# Patient Record
Sex: Male | Born: 1955 | Race: Black or African American | Hispanic: No | Marital: Single
Health system: Midwestern US, Community
[De-identification: ages and names within clinical notes are randomized; demographics above are authoritative.]

## PROBLEM LIST (undated history)

## (undated) DIAGNOSIS — I10 Essential (primary) hypertension: Secondary | ICD-10-CM

## (undated) DIAGNOSIS — N3289 Other specified disorders of bladder: Principal | ICD-10-CM

## (undated) DIAGNOSIS — N329 Bladder disorder, unspecified: Secondary | ICD-10-CM

## (undated) DIAGNOSIS — R195 Other fecal abnormalities: Secondary | ICD-10-CM

## (undated) DIAGNOSIS — Z1211 Encounter for screening for malignant neoplasm of colon: Secondary | ICD-10-CM

## (undated) DIAGNOSIS — I1A0 Resistant hypertension: Secondary | ICD-10-CM

## (undated) DIAGNOSIS — L723 Sebaceous cyst: Secondary | ICD-10-CM

## (undated) DIAGNOSIS — L720 Epidermal cyst: Secondary | ICD-10-CM

## (undated) DIAGNOSIS — R011 Cardiac murmur, unspecified: Secondary | ICD-10-CM

## (undated) HISTORY — PX: COLONOSCOPY: SHX5424

---

## 2013-05-23 ENCOUNTER — Emergency Department (HOSPITAL_COMMUNITY)
Admission: EM | Admit: 2013-05-23 | Discharge: 2013-05-24 | Disposition: A | Discharge status: Home / Self Care | Payer: 59 | Attending: Emergency Medicine | Admitting: Emergency Medicine

## 2013-05-23 ENCOUNTER — Encounter (HOSPITAL_COMMUNITY): Payer: Self-pay | Admitting: Emergency Medicine

## 2013-05-23 ENCOUNTER — Emergency Department (HOSPITAL_COMMUNITY): Payer: 59

## 2013-05-23 DIAGNOSIS — F17200 Nicotine dependence, unspecified, uncomplicated: Secondary | ICD-10-CM | POA: Insufficient documentation

## 2013-05-23 DIAGNOSIS — R55 Syncope and collapse: Secondary | ICD-10-CM

## 2013-05-23 DIAGNOSIS — R011 Cardiac murmur, unspecified: Secondary | ICD-10-CM | POA: Insufficient documentation

## 2013-05-23 DIAGNOSIS — R112 Nausea with vomiting, unspecified: Secondary | ICD-10-CM | POA: Insufficient documentation

## 2013-05-23 DIAGNOSIS — R61 Generalized hyperhidrosis: Secondary | ICD-10-CM | POA: Insufficient documentation

## 2013-05-23 HISTORY — DX: Cardiac murmur, unspecified: R01.1

## 2013-05-23 LAB — I-STAT TROPONIN, ED: TROPONIN I, POC: 0 ng/mL (ref 0.00–0.08)

## 2013-05-23 LAB — CBC WITH DIFFERENTIAL/PLATELET
BASOS ABS: 0 10*3/uL (ref 0.0–0.1)
Basophils Relative: 1 % (ref 0–1)
EOS ABS: 0.1 10*3/uL (ref 0.0–0.7)
EOS PCT: 1 % (ref 0–5)
HEMATOCRIT: 40.2 % (ref 39.0–52.0)
Hemoglobin: 13.4 g/dL (ref 13.0–17.0)
Lymphocytes Relative: 28 % (ref 12–46)
Lymphs Abs: 1.8 10*3/uL (ref 0.7–4.0)
MCH: 28.5 pg (ref 26.0–34.0)
MCHC: 33.3 g/dL (ref 30.0–36.0)
MCV: 85.5 fL (ref 78.0–100.0)
MONO ABS: 0.4 10*3/uL (ref 0.1–1.0)
Monocytes Relative: 6 % (ref 3–12)
Neutro Abs: 4.3 10*3/uL (ref 1.7–7.7)
Neutrophils Relative %: 64 % (ref 43–77)
Platelets: 228 10*3/uL (ref 150–400)
RBC: 4.7 MIL/uL (ref 4.22–5.81)
RDW: 15.3 % (ref 11.5–15.5)
WBC: 6.7 10*3/uL (ref 4.0–10.5)

## 2013-05-23 LAB — URINALYSIS, ROUTINE W REFLEX MICROSCOPIC
Bilirubin Urine: NEGATIVE
Glucose, UA: NEGATIVE mg/dL
Ketones, ur: 15 mg/dL — AB
Nitrite: NEGATIVE
PH: 6 (ref 5.0–8.0)
Protein, ur: NEGATIVE mg/dL
Specific Gravity, Urine: 1.02 (ref 1.005–1.030)
Urobilinogen, UA: 0.2 mg/dL (ref 0.0–1.0)

## 2013-05-23 LAB — COMPREHENSIVE METABOLIC PANEL
ALT: 17 U/L (ref 0–53)
AST: 17 U/L (ref 0–37)
Albumin: 3.7 g/dL (ref 3.5–5.2)
Alkaline Phosphatase: 65 U/L (ref 39–117)
BUN: 17 mg/dL (ref 6–23)
CALCIUM: 8.8 mg/dL (ref 8.4–10.5)
CO2: 22 meq/L (ref 19–32)
CREATININE: 1.49 mg/dL — AB (ref 0.50–1.35)
Chloride: 105 mEq/L (ref 96–112)
GFR calc Af Amer: 59 mL/min — ABNORMAL LOW (ref >90)
GFR, EST NON AFRICAN AMERICAN: 51 mL/min — AB (ref >90)
Glucose, Bld: 93 mg/dL (ref 70–99)
Potassium: 3.9 mEq/L (ref 3.7–5.3)
Sodium: 144 mEq/L (ref 137–147)
Total Bilirubin: <0.2 mg/dL — ABNORMAL LOW (ref 0.3–1.2)
Total Protein: 7.4 g/dL (ref 6.0–8.3)

## 2013-05-23 LAB — I-STAT CHEM 8, ED
BUN: 17 mg/dL (ref 6–23)
CHLORIDE: 107 meq/L (ref 96–112)
Calcium, Ion: 1.12 mmol/L (ref 1.12–1.23)
Creatinine, Ser: 1.4 mg/dL — ABNORMAL HIGH (ref 0.50–1.35)
Glucose, Bld: 99 mg/dL (ref 70–99)
HCT: 41 % (ref 39.0–52.0)
Hemoglobin: 13.9 g/dL (ref 13.0–17.0)
POTASSIUM: 4.4 meq/L (ref 3.7–5.3)
SODIUM: 144 meq/L (ref 137–147)
TCO2: 22 mmol/L (ref 0–100)

## 2013-05-23 LAB — URINE MICROSCOPIC-ADD ON

## 2013-05-23 LAB — ETHANOL: Alcohol, Ethyl (B): 26 mg/dL — ABNORMAL HIGH (ref 0–11)

## 2013-05-23 LAB — I-STAT CG4 LACTIC ACID, ED
Lactic Acid, Venous: 3.34 mmol/L — ABNORMAL HIGH (ref 0.5–2.2)
Lactic Acid, Venous: 1.62 mmol/L (ref 0.5–2.2)

## 2013-05-23 MED ORDER — SODIUM CHLORIDE 0.9 % IV BOLUS (SEPSIS)
1000.0000 mL | Freq: Once | INTRAVENOUS | Status: AC
  Administered 2013-05-23: 1000 mL via INTRAVENOUS

## 2013-05-23 NOTE — ED Notes (Addendum)
Pt to ED via [facility] for evaluation of a syncopal episode prior to arrival- family reports pt was in seated position when he slumped out of chair and "fell out"- pt became alert 30 seconds after episode, pt was incontinent of urine, denies any injury during syncopal episode.  EMS found palpated BP of 60, IV started and 700cc NS given, pt current BP 135/78- pt alert and oriented X 4 and speaking in full sentences.  Pt had 2 episodes of vomiting with EMS, 4mg  Zofran given.  IV in place.

## 2013-05-23 NOTE — Discharge Instructions (Signed)
Syncope  Syncope is a fainting spell. This means the person loses consciousness and drops to the ground. The person is generally unconscious for less than 5 minutes. The person may have some muscle twitches for up to 15 seconds before waking up and returning to normal. Syncope occurs more often in elderly people, but it can happen to anyone. While most causes of syncope are not dangerous, syncope can be a sign of a serious medical problem. It is important to seek medical care.   CAUSES   Syncope is caused by a sudden decrease in blood flow to the brain. The specific cause is often not determined. Factors that can trigger syncope include:   Taking medicines that lower blood pressure.   Sudden changes in posture, such as standing up suddenly.   Taking more medicine than prescribed.   Standing in one place for too long.   Seizure disorders.   Dehydration and excessive exposure to heat.   Low blood sugar (hypoglycemia).   Straining to have a bowel movement.   Heart disease, irregular heartbeat, or other circulatory problems.   Fear, emotional distress, seeing blood, or severe pain.  SYMPTOMS   Right before fainting, you may:   Feel dizzy or lightheaded.   Feel nauseous.   See all white or all black in your field of vision.   Have cold, clammy skin.  DIAGNOSIS   Your caregiver will ask about your symptoms, perform a physical exam, and perform electrocardiography (ECG) to record the electrical activity of your heart. Your caregiver may also perform other heart or blood tests to determine the cause of your syncope.  TREATMENT   In most cases, no treatment is needed. Depending on the cause of your syncope, your caregiver may recommend changing or stopping some of your medicines.  HOME CARE INSTRUCTIONS   Have someone stay with you until you feel stable.   Do not drive, operate machinery, or play sports until your caregiver says it is okay.   Keep all follow-up appointments as directed by your  caregiver.   Lie down right away if you start feeling like you might faint. Breathe deeply and steadily. Wait until all the symptoms have passed.   Drink enough fluids to keep your urine clear or pale yellow.   If you are taking blood pressure or heart medicine, get up slowly, taking several minutes to sit and then stand. This can reduce dizziness.  SEEK IMMEDIATE MEDICAL CARE IF:    You have a severe headache.   You have unusual pain in the chest, abdomen, or back.   You are bleeding from the mouth or rectum, or you have black or tarry stool.   You have an irregular or very fast heartbeat.   You have pain with breathing.   You have repeated fainting or seizure-like jerking during an episode.   You faint when sitting or lying down.   You have confusion.   You have difficulty walking.   You have severe weakness.   You have vision problems.  If you fainted, call your local emergency services (911 in [place] Do not drive yourself to the hospital.   MAKE SURE YOU:   Understand these instructions.   Will watch your condition.   Will get help right away if you are not doing well or get worse.  Document Released: 12/31/2004 Document Revised: 07/02/2011 Document Reviewed: 03/01/2011  [employer] Patient Information 2014 [employer].

## 2013-05-23 NOTE — ED Provider Notes (Signed)
CSN: [id number]     Arrival date & time 05/23/13  2003 History   First MD Initiated Contact with Patient 05/23/13 2007     Chief Complaint  Patient presents with  . Near Syncope     (Consider location/radiation/quality/duration/timing/severity/associated sxs/prior Treatment) HPI Comments: Patient is a 58 year old male with history of heart murmur who presents today for evaluation of syncope. He reports that he is from [place], but is here visiting and went to a Mother's Day gathering. He states that he felt "really hot" and nauseated just prior to syncopizing. He was sitting in a chair and slumped over in the chair. He did not hit his head or sustain any injuries. This was witnessed by his family members who report he lost consciousness for about 30 seconds. He was incontinent of urine. He has 2 episodes of vomiting. He had not eaten much all day long, but did eat at this gathering. He also feels as though he is sleep deprived. He was up late watching tv last night and woke up early this morning to drive here. He has had a similar episode of syncope in the past. [place]. He was evaluated in the hospital where they gave him an IV, told him he was dehydrated, and sent him home. He denies chest pain, shortness of breath, or recent illness.   Patient is a 58 y.o. male presenting with near-syncope. The history is provided by the patient. No language interpreter was used.  Near Syncope Associated symptoms include diaphoresis, nausea and vomiting. Pertinent negatives include no abdominal pain, chest pain, chills or fever.    Past Medical History  Diagnosis Date  . Heart murmur    Past Surgical History  Procedure Laterality Date  . Colonoscopy     No family history on file. History  Substance Use Topics  . Smoking status: Current Some Day Smoker  . Smokeless tobacco: Not on file  . Alcohol Use: Yes    Review of Systems  Constitutional:  Positive for diaphoresis. Negative for fever and chills.  Respiratory: Negative for shortness of breath.   Cardiovascular: Positive for near-syncope. Negative for chest pain.  Gastrointestinal: Positive for nausea and vomiting. Negative for abdominal pain and diarrhea.  Neurological: Positive for syncope.  All other systems reviewed and are negative.     Allergies  Review of patient's allergies indicates no known allergies.  Home Medications   Prior to Admission medications   Not on File   BP 135/78  Temp(Src) 98 F (36.7 C) (Oral)  Resp 17  SpO2 96% Physical Exam  Nursing note and vitals reviewed. Constitutional: He is oriented to person, place, and time. He appears well-developed and well-nourished. No distress.  HENT:  Head: Normocephalic and atraumatic.  Right Ear: External ear normal.  Left Ear: External ear normal.  Nose: Nose normal.  Eyes: Conjunctivae and EOM are normal. Pupils are equal, round, and reactive to light.  Neck: Normal range of motion. No spinous process tenderness and no muscular tenderness present. No tracheal deviation present.  Cardiovascular: Normal rate, regular rhythm and normal heart sounds.   Pulmonary/Chest: Effort normal and breath sounds normal. No stridor.  Abdominal: Soft. He exhibits no distension. There is no tenderness.  Musculoskeletal: Normal range of motion.  Neurological: He is alert and oriented to person, place, and time.  Skin: Skin is warm and dry. He is not diaphoretic.  Psychiatric: He has a normal mood and  affect. His behavior is normal.    ED Course  Procedures (including critical care time) Labs Review Labs Reviewed  COMPREHENSIVE METABOLIC PANEL - Abnormal; Notable for the following:    Creatinine, Ser 1.49 (*)    Total Bilirubin <0.2 (*)    GFR calc non Af Amer 51 (*)    GFR calc Af Amer 59 (*)    All other components within normal limits  ETHANOL - Abnormal; Notable for the following:    Alcohol, Ethyl (B) 26  (*)    All other components within normal limits  URINALYSIS, ROUTINE W REFLEX MICROSCOPIC - Abnormal; Notable for the following:    Hgb urine dipstick TRACE (*)    Ketones, ur 15 (*)    Leukocytes, UA TRACE (*)    All other components within normal limits  URINE MICROSCOPIC-ADD ON - Abnormal; Notable for the following:    Bacteria, UA FEW (*)    Casts HYALINE CASTS (*)    All other components within normal limits  I-STAT CG4 LACTIC ACID, ED - Abnormal; Notable for the following:    Lactic Acid, Venous 3.34 (*)    All other components within normal limits  I-STAT CHEM 8, ED - Abnormal; Notable for the following:    Creatinine, Ser 1.40 (*)    All other components within normal limits  URINE CULTURE  CBC WITH DIFFERENTIAL  I-STAT TROPOININ, ED  I-STAT CG4 LACTIC ACID, ED  I-STAT CG4 LACTIC ACID, ED  I-STAT CHEM 8, ED    Imaging Review Dg Chest Port 1 View  05/23/2013   CLINICAL DATA:  Syncopal episode  EXAM: PORTABLE CHEST - 1 VIEW  COMPARISON:  None.  FINDINGS: Cardiac shadow is mildly enlarged. The lungs are clear bilaterally. No acute bony abnormality is seen.  IMPRESSION: No acute abnormality noted.   Electronically Signed   By: Alcide CleverMark  Lukens M.D.   On: 05/23/2013 21:08     EKG Interpretation   Date/Time:  Sunday May 23 2013 20:15:46 EDT Ventricular Rate:  93 PR Interval:  149 QRS Duration: 87 QT Interval:  359 QTC Calculation: 446 R Axis:   21 Text Interpretation:  Sinus rhythm Borderline T abnormalities, inferior  leads No prior EKG for comparison Confirmed by Gwendolyn GrantWALDEN  MD, BLAIR ([id number]) on  05/23/2013 8:23:53 PM      MDM   Final diagnoses:  Syncope   Patient is a 58 year old male who presents to the ED for evaluation of syncope. Initially lactic acid was elevated at 3.34. This normalized with fluid. Labs are otherwise unremarkable. EKG shows no arrythmia to suggest cardiac etiology of syncope. Patient feels significantly improved after fluid. No recurrence of  symptoms. Discussed reasons to return to ED immediately. Vital signs stable for discharge. Dr. Gwendolyn GrantWalden evaluated this patient and agrees with plan. Patient / Family / Caregiver informed of clinical course, understand medical decision-making process, and agree with plan.   Mora BellmanHannah S Myla Mauriello, PA-C 05/25/13 1501

## 2013-05-23 NOTE — ED Notes (Signed)
Pt given urinal and told we need urine specimen.  Completing EMS NS bolus before starting 2nd bolus.

## 2013-05-23 NOTE — ED Notes (Signed)
Pt asked if he could obtain a urine sample. Pt couldn't at this time. Will follow up shortly.

## 2013-05-23 NOTE — ED Notes (Signed)
Dr Gwendolyn GrantWalden given a copy of lactic acid results 3.34

## 2013-05-24 LAB — URINE CULTURE: Colony Count: 50000

## 2013-05-27 NOTE — ED Provider Notes (Signed)
Medical screening examination/treatment/procedure(s) were conducted as a shared visit with non-physician practitioner(s) and myself.  I personally evaluated the patient during the encounter.   EKG Interpretation   Date/Time:  Sunday May 23 2013 20:15:46 EDT Ventricular Rate:  93 PR Interval:  149 QRS Duration: 87 QT Interval:  359 QTC Calculation: 446 R Axis:   21 Text Interpretation:  Sinus rhythm Borderline T abnormalities, inferior  leads No prior EKG for comparison Confirmed by Aayushi Solorzano  MD, Kaiden Dardis ([id number]) on  05/23/2013 8:23:53 PM      57 M here with syncope. Happened previously due to dehydration. Has been cooking outside over a grill most of the day. Patient here feeling well, vitals ok. Initial lactate 3.34, improved after fluid. Labs ok. Neurologically intact, relaxing comfortably. Feeling much better after fluids, syncope likely due to some dehydration. Stable for discharge.  [name], MD 05/27/13 [phone]

## 2013-08-31 ENCOUNTER — Inpatient Hospital Stay
Admit: 2013-08-31 | Discharge: 2013-08-31 | Disposition: A | Discharge status: Home / Self Care | Attending: Personal Emergency Response Attendant

## 2013-08-31 NOTE — ED Notes (Signed)
Patient cleared for discharge per MD. Patient discharge instructions explained, Rx given and explained to patient. Patient Verbalized understanding of all instructions and all patient questions answered to their satisfaction. Patient departs from ED in stable condition.       Deanne CofferJennifer N Elwyn Klosinski, RN  08/31/13 [phone]

## 2013-08-31 NOTE — ED Notes (Signed)
Bed: 04  Expected date:   Expected time:   Means of arrival:   Comments:  Adam Cochran, Adam Cochran

## 2013-08-31 NOTE — ED Provider Notes (Signed)
Attending Supervisory Note/Shared Visit   I have personally performed a face to face diagnostic evaluation on this patient. I have reviewed the mid-level???s findings and agree.  History and Exam by me shows infected sebaceous cyst      (Please note that portions of this note were completed with a voice recognition program.  Efforts were made to edit the dictations but occasionally words are mis-transcribed.)    Alveria Apley, MD  Attending Emergency Physician      Alveria Apley, MD  08/31/13 1910

## 2013-08-31 NOTE — ED Provider Notes (Signed)
[facility]  eMERGENCY dEPARTMENT eNCOUnter      Pt Name: Adam Cochran  MRN: [id number]  [place] Sep 21, 1955  Date of evaluation: 08/31/2013  Provider: Derrill Kay, NP    [place]       Chief Complaint   Patient presents with   ??? Facial Swelling     rt         HISTORY OF PRESENT ILLNESS  (Location/Symptom, Timing/Onset, Context/Setting, Quality, Duration, Modifying Factors, Severity.)   Adam Cochran is a 58 y.o. male who presents to the emergency department via private auto for a red, raised are to the right side of his face which has been draining. Onset was Thursday 08/26/13. States he woke up today with swelling to his right lower eyelid. Denies eye pain, vision changes, fever, chills. Rates his pain 6/10 at this time. States when the area started to drain he then started to squeeze the area.        Nursing Notes were reviewed.    ALLERGIES     Review of patient's allergies indicates no known allergies.    CURRENT MEDICATIONS       There are no discharge medications for this patient.      PAST MEDICAL HISTORY         Diagnosis Date   ??? Hypertension        SURGICAL HISTORY     History reviewed. No pertinent past surgical history.      FAMILY HISTORY     History reviewed. No pertinent family history.  No family status information on file.        SOCIAL HISTORY      reports that he has been smoking.  He does not have any smokeless tobacco history on file. He reports that he does not drink alcohol or use illicit drugs.    REVIEW OF SYSTEMS    (2-9 systems for level 4, 10 or more for level 5)   Review of Systems   Constitutional: Negative for fever, chills, diaphoresis, activity change, appetite change and fatigue.   HENT: Negative for congestion, ear discharge, ear pain, postnasal drip, rhinorrhea, sinus pressure and sore throat.    Eyes: Negative for photophobia, pain, discharge, redness, itching and visual disturbance.   Respiratory: Negative for cough and shortness of breath.     Cardiovascular: Negative for chest pain and palpitations.   Gastrointestinal: Negative for nausea, vomiting, abdominal pain and diarrhea.   Genitourinary: Negative for dysuria.   Musculoskeletal: Negative for myalgias, arthralgias, neck pain and neck stiffness.   Skin: Positive for color change and wound. Negative for rash.   Neurological: Negative for dizziness, weakness, light-headedness and headaches.     Except as noted above the remainder of the review of systems was reviewed and negative.     PHYSICAL EXAM    (up to 7 for level 4, 8 or more for level 5)   ED Triage Vitals   BP Temp Temp src Pulse Resp SpO2 Height Weight   -- -- -- -- -- -- -- --               Physical Exam   Constitutional: He is oriented to person, place, and time. He appears well-developed and well-nourished. No distress.   HENT:   Head: Normocephalic and atraumatic.   Mouth/Throat: Oropharynx is clear and moist.   Eyes: Conjunctivae and EOM are normal. Pupils are equal, round, and reactive to light. Right eye exhibits no discharge and no hordeolum. Left  eye exhibits no discharge and no hordeolum.   Neck: Normal range of motion. Neck supple.   Cardiovascular: Normal rate, regular rhythm and normal heart sounds.    Pulmonary/Chest: Effort normal and breath sounds normal. No respiratory distress. He has no wheezes. He has no rales.   Lymphadenopathy:     He has no cervical adenopathy.   Neurological: He is alert and oriented to person, place, and time.   Skin: Skin is warm and dry. No rash noted. He is not diaphoretic.   Erythematous, raised areas noted to right upper cheek. The lateral area appears infected. Mild swelling and erythema noted to right lower eyelid.    Vitals reviewed.      EMERGENCY DEPARTMENT COURSE and DIFFERENTIAL DIAGNOSIS/MDM:   Vitals:    Filed Vitals:    08/31/13 1923   BP: 133/78   Pulse: 87   Temp: 98.3 ??F (36.8 ??C)   TempSrc: Oral   Resp: 16   Height: 5' 10"$  (1.778 m)   Weight: 154 lb 1.6 oz (69.9 kg)   SpO2: 100%        CLINICAL DECISION MAKING:  The patient presented alert with a nontoxic appearance and was seen in conjunction with Dr. Philippa Chester. Prescriptions were written for bactrim and keflex. He was instructed to follow up with a family physician (a list was provided), return to ED if condition worsens.      FINAL IMPRESSION      1. Sebaceous cyst          DISPOSITION/PLAN   DISPOSITION Decision to Discharge    PATIENT REFERRED TO:   see list for family physician          [facility]  [address]  [place] [address]  [phone]    As needed, If symptoms worsen      DISCHARGE MEDICATIONS:     There are no discharge medications for this patient.          (Please note that portions of this note were completed with a voice recognition program.  Efforts were made to edit the dictations but occasionally words are mis-transcribed.)    Derrill Kay, NP      Derrill Kay, NP  08/31/13 2037

## 2015-01-26 ENCOUNTER — Encounter: Admit: 2015-01-26 | Discharge: 2015-02-09 | Primary: Internal Medicine

## 2015-01-26 ENCOUNTER — Inpatient Hospital Stay
Admit: 2015-01-26 | Discharge: 2015-01-27 | Disposition: A | Discharge status: Home / Self Care | Attending: Emergency Medicine

## 2015-01-26 DIAGNOSIS — S46912A Strain of unspecified muscle, fascia and tendon at shoulder and upper arm level, left arm, initial encounter: Secondary | ICD-10-CM

## 2015-01-26 MED ORDER — KETOROLAC TROMETHAMINE 60 MG/2ML IM SOLN
60 MG/2ML | Freq: Once | INTRAMUSCULAR | Status: AC
Start: 2015-01-26 — End: 2015-01-26
  Administered 2015-01-26: via INTRAMUSCULAR

## 2015-01-26 MED FILL — KETOROLAC TROMETHAMINE 60 MG/2ML IM SOLN: 60 MG/2ML | INTRAMUSCULAR | Qty: 2

## 2015-01-26 NOTE — ED Provider Notes (Signed)
[facility] EMERGENCY DEPARTMENT  eMERGENCY dEPARTMENT eNCOUnter      Pt Name: Adam Cochran  MRN: [id number]  Birthdate 1955-05-16  Date of evaluation: 01/26/2015  Provider: Eudelia Bunch, NP    CHIEF COMPLAINT       Chief Complaint   Patient presents with   ??? Shoulder Pain     x1 day         HISTORY OF PRESENT ILLNESS  (Location/Symptom, Timing/Onset, Context/Setting, Quality, Duration, Modifying Factors, Severity.)   Adam Cochran is a 60 y.o. male who presents to the emergency department by private auto for evaluation of left shoulder pain starting yesterday.  Patient denies injuring himself.  Patient states he woke up with his shoulder hurting him.  Pain is worse with movement better with rest.  Patient denies numbness tingling in his arm.  Patient states he took Motrin today without relief.      Nursing Notes were reviewed.    PAST MEDICAL HISTORY     Past Medical History   Diagnosis Date   ??? Hypertension          SURGICAL HISTORY     History reviewed. No pertinent past surgical history.      CURRENT MEDICATIONS       Previous Medications    No medications on file       ALLERGIES     Review of patient's allergies indicates no known allergies.    FAMILY HISTORY     History reviewed. No pertinent family history.       SOCIAL HISTORY       Social History     Social History   ??? Marital status: Single     Spouse name: N/A   ??? Number of children: N/A   ??? Years of education: N/A     Social History Main Topics   ??? Smoking status: Current Some Day Smoker     Types: Cigars   ??? Smokeless tobacco: None   ??? Alcohol use No   ??? Drug use: Yes     Special: Marijuana   ??? Sexual activity: No     Other Topics Concern   ??? None     Social History Narrative         REVIEW OF SYSTEMS    (2-9 systems for level 4, 10 or more for level 5)     Review of Systems   Musculoskeletal: Positive for arthralgias and joint swelling.   All other systems reviewed and are negative.    Except as noted above the remainder of the review of systems was reviewed and  negative.     PHYSICAL EXAM    (up to 7 for level 4, 8 or more for level 5)   ED Triage Vitals   BP Temp Temp Source Pulse Resp SpO2 Height Weight   01/26/15 1822 01/26/15 1822 01/26/15 1822 01/26/15 1822 01/26/15 1822 01/26/15 1822 01/26/15 1822 01/26/15 1822   159/93 98 ??F (36.7 ??C) Oral 87 16 100 % 5\' 10"  (1.778 m) 151 lb 7 oz (68.7 kg)       Physical Exam   Constitutional: He is oriented to person, place, and time. He appears well-developed and well-nourished.   HENT:   Head: Normocephalic and atraumatic.   Right Ear: External ear normal.   Left Ear: External ear normal.   Nose: Nose normal.   Mouth/Throat: Oropharynx is clear and moist.   Eyes: Conjunctivae and EOM are normal. Pupils are equal, round, and reactive to light.  Neck: Trachea normal, normal range of motion and phonation normal. Neck supple. Muscular tenderness present. No spinous process tenderness present. No rigidity. No edema, no erythema and normal range of motion present.       Pulmonary/Chest: Effort normal. No respiratory distress.   Musculoskeletal:        Left shoulder: He exhibits decreased range of motion, tenderness, swelling, pain and decreased strength. He exhibits no effusion, no crepitus, no deformity and normal pulse.        Arms:  Neurological: He is alert and oriented to person, place, and time. He has normal strength and normal reflexes. No cranial nerve deficit or sensory deficit.   Skin: Skin is warm, dry and intact.   Psychiatric: He has a normal mood and affect. His speech is normal and behavior is normal. Judgment and thought content normal. Cognition and memory are normal.         DIAGNOSTIC RESULTS     EKG: All EKG's are interpreted by the Emergency Department Physician who either signs or Co-signs this chart in the absence of a cardiologist.        RADIOLOGY:   Non-plain film images such as CT, Ultrasound and MRI are read by the radiologist. Plain radiographic images are visualized and preliminarily interpreted by the  emergency physician with the below findings:  Xr Shoulder Left Standard    Result Date: 01/26/2015  EXAMINATION: 3 VIEWS OF THE LEFT SHOULDER 01/26/2015 6:28 pm COMPARISON: None. HISTORY: ORDERING SYSTEM PROVIDED HISTORY: pain since yesterday TECHNOLOGIST PROVIDED HISTORY: Reason for exam:->pain since yesterday Ordering Physician Provided Reason for Exam: pain left shoulder Acuity: Unknown 2 days Type of Exam: Unknown Relevant Medical/Surgical History: none known 60 year old male, left shoulder pain x2 days, no known injury FINDINGS: Bone mineralization and alignment appear intact.  No evidence of acute fracture or dislocation.     No acute findings.       Interpretation per the Radiologist below, if available at the time of this note:    XR Shoulder Left Standard   Final Result   No acute findings.               ED BEDSIDE ULTRASOUND:   Performed by ED Physician - none    LABS:  Labs Reviewed - No data to display    All other labs were within normal range or not returned as of this dictation.    EMERGENCY DEPARTMENT COURSE and DIFFERENTIAL DIAGNOSIS/MDM:   X-ray of left shoulder negative for acute abnormalities.  He exhibits anterior shoulder pain when moving his arm.  Rest arm placed in a sling.  Splint check by provider.  Patient received Toradol ED.  My clinical impression is shoulder strain.  Patient will be discharged with Robaxin and Norco.  He is instructed to follow-up with Dr. Allena Katz with orthopedics for further evaluation of his shoulder pain.  Return ED if symptoms worsen.            Vitals:    Vitals:    01/26/15 1822   BP: (!) 159/93   Pulse: 87   Resp: 16   Temp: 98 ??F (36.7 ??C)   TempSrc: Oral   SpO2: 100%   Weight: 151 lb 7 oz (68.7 kg)   Height: 5\' 10"  (1.778 m)         CONSULTS:  None    PROCEDURES:  Procedures    FINAL IMPRESSION      1. Shoulder strain, left, initial encounter    2. Radiculopathy,  unspecified spinal region          DISPOSITION/PLAN   DISPOSITION Decision to Discharge    PATIENT  REFERRED TO:     [facility] for further evaluation of your neck and shoulder pain.  Schedule an appointment as soon as possible for a visit      [facility]  [address]  [place] [id number]  [phone]    If symptoms worsen      DISCHARGE MEDICATIONS:     New Prescriptions    HYDROCODONE-ACETAMINOPHEN (NORCO) 5-325 MG PER TABLET    Take 1 tablet by mouth every 6 hours as needed for Pain .    METHOCARBAMOL (ROBAXIN-750) 750 MG TABLET    Take 1 tablet by mouth 3 times daily as needed (pain)     Electronically signed by Eudelia BunchEric Joslyn Ramos, NP on 01/26/2015 at 7:51 PM              Eudelia BunchEric Caiya Bettes, NP  01/26/15 1952

## 2015-01-26 NOTE — ED Notes (Signed)
Pt presents to ED with c/o left shoulder pain. Pt states he started having left shoulder pain yesterday and has increased pain today that radiates down his back. Pt denies injury. Pt has increase of pain upon raising left arm. Pt denies SOB and chest pain. Pt's respirations are even and non-labored, no distress noted at this time, will continue to monitor pt.      Charlaine DaltonKylie K Podach, RN  01/26/15 1825

## 2015-01-26 NOTE — Other (Addendum)
Patient Acct Nbr:  [id number]  Primary AUTH/CERT:    Primary Insurance Company Name:   [employer]  Primary Insurance Plan Name:    Primary Insurance Group Number:    Primary Insurance Plan Type: Games developer  Primary Insurance Policy Number:  [id number]

## 2015-01-26 NOTE — ED Provider Notes (Signed)
Attending Supervisory Note/Shared Visit   I have personally performed a face to face diagnostic evaluation on this patient. I have reviewed the mid-level???s findings and agree.        (Please note that portions of this note were completed with a voice recognition program.  Efforts were made to edit the dictations but occasionally words are mis-transcribed.)    Wess BottsSyed Z Vasti Yagi, MD  Attending Emergency Physician       Wess BottsSyed Z Kimon Loewen, MD  01/26/15 1949

## 2015-01-27 MED ORDER — METHOCARBAMOL 750 MG PO TABS
750 MG | ORAL_TABLET | Freq: Three times a day (TID) | ORAL | 0 refills | Status: AC | PRN
Start: 2015-01-27 — End: 2015-02-05

## 2015-01-27 MED ORDER — HYDROCODONE-ACETAMINOPHEN 5-325 MG PO TABS
5-325 MG | ORAL_TABLET | Freq: Four times a day (QID) | ORAL | 0 refills | Status: AC | PRN
Start: 2015-01-27 — End: 2015-01-31

## 2015-03-30 ENCOUNTER — Emergency Department: Admit: 2015-03-30 | Primary: Internal Medicine

## 2015-03-30 ENCOUNTER — Inpatient Hospital Stay
Admit: 2015-03-30 | Discharge: 2015-03-30 | Disposition: A | Discharge status: Home / Self Care | Attending: Emergency Medicine

## 2015-03-30 DIAGNOSIS — M25551 Pain in right hip: Secondary | ICD-10-CM

## 2015-03-30 LAB — POC GLUCOSE FINGERSTICK: POC Glucose: 112 mg/dL — ABNORMAL HIGH (ref 75–110)

## 2015-03-30 LAB — POCT GLUCOSE: Glucose: 112 mg/dL

## 2015-03-30 MED ORDER — DEXAMETHASONE SODIUM PHOSPHATE 10 MG/ML IJ SOLN
10 MG/ML | Freq: Once | INTRAMUSCULAR | Status: AC
Start: 2015-03-30 — End: 2015-03-30
  Administered 2015-03-30: 06:00:00 10 mg via INTRAMUSCULAR

## 2015-03-30 MED ORDER — LIDOCAINE 5 % EX PTCH
5 % | MEDICATED_PATCH | Freq: Every day | CUTANEOUS | 0 refills | Status: DC
Start: 2015-03-30 — End: 2018-12-14

## 2015-03-30 MED ORDER — HYDROCODONE-ACETAMINOPHEN 5-325 MG PO TABS
5-325 MG | ORAL_TABLET | ORAL | 0 refills | Status: DC | PRN
Start: 2015-03-30 — End: 2018-12-14

## 2015-03-30 MED FILL — DEXAMETHASONE SODIUM PHOSPHATE 10 MG/ML IJ SOLN: 10 MG/ML | INTRAMUSCULAR | Qty: 1

## 2015-03-30 NOTE — ED Provider Notes (Signed)
[facility]  eMERGENCY dEPARTMENT eNCOUnter      Pt Name: Adam Cochran  MRN: [id number]  Birthdate 08/02/1955  Date of evaluation: 03/30/2015  Provider: Wess BottsSyed Z Dimitrios Balestrieri, MD    CHIEF COMPLAINT       Chief Complaint   Patient presents with   ??? Hip Pain     rt hip pain since yesterday / denies injury / new onset         HISTORY OF PRESENT ILLNESS  (Location/Symptom, Timing/Onset, Context/Setting, Quality, Duration, Modifying Factors, Severity.)   Adam Cochran is a 60 y.o. male who presents to the emergency department with a chief complaint of pain in the lateral aspect of the right hip since yesterday, worse with any kind of movement.  There is no preceding history of trauma.       Nursing Notes were reviewed.    ALLERGIES     Review of patient's allergies indicates no known allergies.    CURRENT MEDICATIONS       Previous Medications    No medications on file       PAST MEDICAL HISTORY         Diagnosis Date   ??? Hypertension        SURGICAL HISTORY     History reviewed. No pertinent past surgical history.      FAMILY HISTORY     History reviewed. No pertinent family history.  No family status information on file.        SOCIAL HISTORY      reports that he has been smoking Cigars.  He does not have any smokeless tobacco history on file. He reports that he uses illicit drugs, including Marijuana. He reports that he does not drink alcohol.    REVIEW OF SYSTEMS    (2-9 systems for level 4, 10 or more for level 5)      Except as noted above the remainder of the review of systems was reviewed and negative.     PHYSICAL EXAM    (up to 7 for level 4, 8 or more for level 5)   ED Triage Vitals   BP Temp Temp Source Pulse Resp SpO2 Height Weight   03/30/15 0115 03/30/15 0115 03/30/15 0115 03/30/15 0115 03/30/15 0115 03/30/15 0115 03/30/15 0115 03/30/15 0115   163/90 98.5 ??F (36.9 ??C) Oral 100 18 100 % 5\' 10"  (1.778 m) 152 lb 1.9 oz (69 kg)       Physical Exam   Constitutional: He appears well-developed and well-nourished. He  appears distressed.   Musculoskeletal:   Tenderness is localized maxillary minimally over the greater trochanter prominence of the proximal right femur laterally.  Range of motion of the right hip is accompanied with pain.  Neurovascular function is intact distally.  There are no overlying skin color or temperature changes.   Skin: Skin is warm and dry. No pallor.   Nursing note and vitals reviewed.        DIAGNOSTIC RESULTS     RADIOLOGY:     Interpretation per the Radiologist below, if available at the time of this note:    Xr Hip Right Standard    Result Date: 03/30/2015  EXAMINATION: 2 VIEWS OF THE RIGHT HIP 03/30/2015 1:29 am COMPARISON: None. HISTORY: ORDERING SYSTEM PROVIDED HISTORY: Pain TECHNOLOGIST PROVIDED HISTORY: Reason for exam:->Pain History of hypertension. FINDINGS: There is no evidence of acute fracture.  There is normal alignment.  No acute joint abnormality.  No focal osseous lesion. No focal  soft tissue abnormality.  Enthesophytes are noted at the greater trochanter.     No acute osseous abnormality. Enthesophytes are noted at the greater trochanter.         LABS:  Results for orders placed or performed during the hospital encounter of 03/30/15   POCT Glucose   Result Value Ref Range    Glucose 112 mg/dL    QC OK? ok    POC Glucose Fingerstick   Result Value Ref Range    POC Glucose 112 (H) 75 - 110 mg/dL       All other labs were within normal range or not returned as of this dictation.    EMERGENCY DEPARTMENT COURSE and DIFFERENTIAL DIAGNOSIS/MDM:   Vitals:    Vitals:    03/30/15 0115   BP: (!) 163/90   Pulse: 100   Resp: 18   Temp: 98.5 ??F (36.9 ??C)   TempSrc: Oral   SpO2: 100%   Weight: 152 lb 1.9 oz (69 kg)   Height:  (1.778 m)       Orders Placed This Encounter   Medications   ??? dexamethasone (DECADRON) injection 10 mg   ??? HYDROcodone-acetaminophen (NORCO) 5-325 MG per tablet     Sig: Take 1 tablet by mouth every 4 hours as needed for Pain .     Dispense:  20 tablet     Refill:  0   ???  lidocaine (LIDODERM) 5 %     Sig: Place 1 patch onto the skin daily 12 hours on, 12 hours off.     Dispense:  5 patch     Refill:  0       Medical Decision Making:     Tenderness calcification is identified on the x-rays.  Patient is placed on Norco and Lidoderm patch and treated with Decadron 10 mg IM in the ED prior to discharge.  He is referred to outpatient orthopedics follow-up.    FINAL IMPRESSION      1. Acute right hip pain          DISPOSITION/PLAN   DISPOSITION Decision to Discharge    PATIENT REFERRED TO:   Astrid Drafts, MD  [address]  [address]  [place] [address]  [phone]    Schedule an appointment as soon as possible for a visit        DISCHARGE MEDICATIONS:     New Prescriptions    HYDROCODONE-ACETAMINOPHEN (NORCO) 5-325 MG PER TABLET    Take 1 tablet by mouth every 4 hours as needed for Pain .    LIDOCAINE (LIDODERM) 5 %    Place 1 patch onto the skin daily 12 hours on, 12 hours off.       (Please note that portions of this note were completed with a voice recognition program.  Efforts were made to edit the dictations but occasionally words are mis-transcribed.)    Wess Botts, MD  Attending Emergency Physician           Wess Botts, MD  03/30/15 [phone]

## 2015-10-03 IMAGING — CR DG CHEST 1V PORT
1 series · 1 of 1 positions shown · non-contrast
Comparison: None.

CLINICAL DATA: Syncopal episode

EXAM:
PORTABLE CHEST - 1 VIEW

[AP]
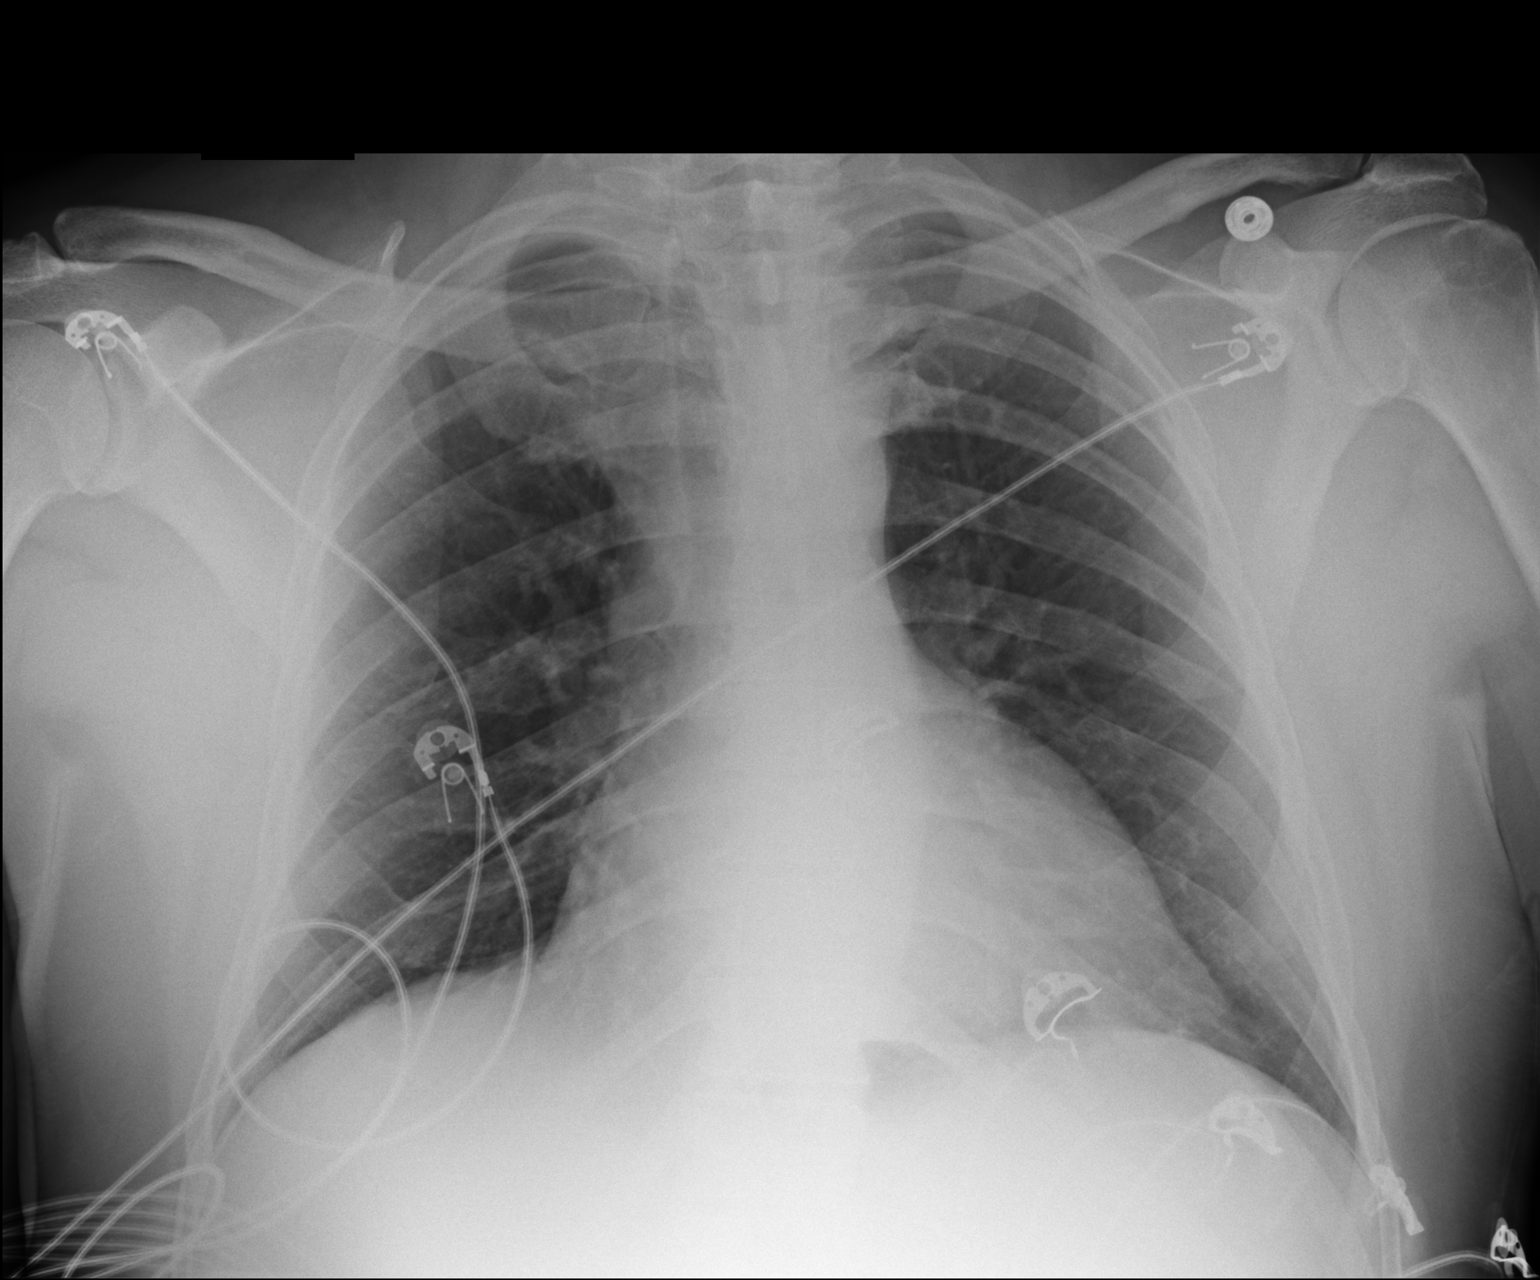

[1 of 1 positions shown; findings below may reference images not displayed]

FINDINGS: Cardiac shadow is mildly enlarged. The lungs are clear bilaterally.
No acute bony abnormality is seen.
IMPRESSION: No acute abnormality noted.

## 2018-10-21 ENCOUNTER — Ambulatory Visit
Admit: 2018-10-21 | Discharge: 2018-10-21 | Payer: PRIVATE HEALTH INSURANCE | Attending: Internal Medicine | Primary: Internal Medicine

## 2018-10-21 DIAGNOSIS — Z13220 Encounter for screening for lipoid disorders: Secondary | ICD-10-CM

## 2018-10-21 MED ORDER — AMLODIPINE BESYLATE 10 MG PO TABS
10 MG | ORAL_TABLET | Freq: Every day | ORAL | 11 refills | Status: DC
Start: 2018-10-21 — End: 2019-01-26

## 2018-10-21 MED ORDER — LOSARTAN POTASSIUM-HCTZ 100-25 MG PO TABS
100-25 MG | ORAL_TABLET | Freq: Every day | ORAL | 3 refills | Status: DC
Start: 2018-10-21 — End: 2019-01-26

## 2018-10-21 NOTE — Progress Notes (Signed)
[facility]    New Patient Note / History and Physical    Date of patient's visit: 10/21/2018     Name: Adam Cochran      Date of Birth: 1955-08-17    Patient Care Team:  Wendy Poet, MD as PCP - General (Internal Medicine)    REASON FOR VISIT: First Visit, establish care     Chief Complaint   Patient presents with   ??? Established New Doctor   ??? Health Maintenance     decline vaccines, labs pended, cologuard pended,    ??? Hypertension       HISTORY OF PRESENTING ILLNESS:    History was obtained from the patient. Adam Cochran is a 63 y.o. is here to establish care.     Has severe hypertension   No active complaints   Normal ROS     He works 2 jobs but has no severe stress at the moment to explain hypertension   No family h/o CAD, HTN OR dm   His BMI is 21, he is very active   He does smoke cigars and occasional marijuana       PAST MEDICALAND SURGICAL HISTORY:          Diagnosis Date   ??? Hypertension        No past surgical history on file.    SOCIAL HISTORY:    TOBACCO:   reports that he has been smoking cigars. He has a 2.00 pack-year smoking history. He quit smokeless tobacco use about 20 years ago.  His smokeless tobacco use included chew.  ETOH:   reports no history of alcohol use.  DRUGS:  reports current drug use. Drug: Marijuana.  OCCUPATION:      ALLERGIES:    No Known Allergies      HOME MEDICATION:      Current Outpatient Medications on File Prior to Visit   Medication Sig Dispense Refill   ??? HYDROcodone-acetaminophen (NORCO) 5-325 MG per tablet Take 1 tablet by mouth every 4 hours as needed for Pain . (Patient not taking: Reported on 10/21/2018) 20 tablet 0   ??? lidocaine (LIDODERM) 5 % Place 1 patch onto the skin daily 12 hours on, 12 hours off. (Patient not taking: Reported on 10/21/2018) 5 patch 0     No current facility-administered medications on file prior to visit.        FAMILY HISTORY:    No family history on file.    REVIEW OF SYSTEMS:    Review of Systems    Constitutional: Negative.    HENT: Negative.    Eyes: Positive for discharge.   Respiratory: Negative.    Gastrointestinal: Negative.    Genitourinary: Negative.    Musculoskeletal: Negative.    Neurological: Negative.    Psychiatric/Behavioral: Negative.        PHYSICAL EXAM:      Vitals:    10/21/18 1419 10/21/18 1425   BP: (!) 195/124 (!) 180/116   Site: Left Upper Arm Left Upper Arm   Position: Sitting Sitting   Cuff Size: Medium Adult Medium Adult   Pulse: 117 106   Weight: 153 lb (69.4 kg)    Height: 5\' 10"  (1.778 m)      Wt Readings from Last 3 Encounters:   10/21/18 153 lb (69.4 kg)   03/30/15 152 lb 1.9 oz (69 kg)   01/26/15 151 lb 7 oz (68.7 kg)     Body mass index is 21.95 kg/m??.  Physical Exam    LABORATORY FINDINGS:    CBC: No results found for: WBC, HGB, PLT  BMP:    Lab Results   Component Value Date    GLUCOSE 112 03/30/2015     Hemoglobin A1C: No results found for: LABA1C  Lipid profile: No results found for: CHOL, TRIG, HDL  No results found for: LDLCALC, LDLCHOLESTEROL, LDLDIRECT    Thyroid functions: No results found for: TSH   Hepatic functions: No results found for: ALT, AST, PROT, BILITOT, BILIDIR, LABALBU  ASSESSMENT AND PLAN:   1. Screening for hyperlipidemia    - Lipid Panel; Future    2. Uncontrolled hypertension    - amLODIPine (NORVASC) 10 MG tablet; Take 1 tablet by mouth daily  Dispense: 30 tablet; Refill: 11  - losartan-hydroCHLOROthiazide (HYZAAR) 100-25 MG per tablet; Take 1 tablet by mouth daily  Dispense: 30 tablet; Refill: 3  - Protein / Creatinine Ratio, Urine; Future  - Urinalysis With Microscopic; Future  - Comprehensive Metabolic Panel; Future  - CBC With Auto Differential; Future    3. Screening for HIV (human immunodeficiency virus)    - HIV-1 And HIV-2 Antibodies; Future    4. Encounter for hepatitis C screening test for low risk patient    - Hepatitis C Antibody; Future    5. Encounter for screening for malignant neoplasm of colon    - Cologuard; Future    6.  Screening for diabetes mellitus (DM)    - Hemoglobin A1C; Future      Plan :   Start antihypertensives for hypertensive urgency   rtc on Friday for a nurse visit bp check     INSTRUCTIONS:   1. Return in about 2 months (around 12/21/2018).    2. Reviewed prior labs and healthmaintenance.      3. Discussed use, benefit, and side effects of prescribed medications.  Barriers tomedication compliance addressed.  All patient questions answered.  Pt voiced understanding.     4. Patient giveneducational materials - see patient instructions      Tanja Port, MD FASN  Attending Physician, [facility]   Faculty, Internal Medicine Residency Program  [facility] Physician - [facility]  10/21/2018, 3:43 PM    10/21/2018, 3:43 PM

## 2018-10-21 NOTE — Patient Instructions (Signed)
Medications e-scribe to pharmacy of pt's choice.   Scripts for lab given to pt with fasting instructions, pt will get labs done before next appt.  Doctor has order a colo guard kit, the company will be in contact with you and mail you the kit with all of the instructions.   Pt was added to wait list for 2 months.   An After Visit Summary was printed and given to the patient.  CB

## 2018-10-22 ENCOUNTER — Inpatient Hospital Stay: Payer: PRIVATE HEALTH INSURANCE | Primary: Internal Medicine

## 2018-10-22 DIAGNOSIS — I10 Essential (primary) hypertension: Secondary | ICD-10-CM

## 2018-10-22 LAB — URINALYSIS WITH MICROSCOPIC
Bilirubin Urine: NEGATIVE
Glucose, Ur: NEGATIVE
Ketones, Urine: NEGATIVE
Leukocyte Esterase, Urine: NEGATIVE
Nitrite, Urine: NEGATIVE
Protein, UA: NEGATIVE
Specific Gravity, UA: 1.017 (ref 1.005–1.030)
Urine Hgb: NEGATIVE
Urobilinogen, Urine: NORMAL
pH, UA: 5.5 (ref 5.0–8.0)

## 2018-10-22 LAB — COMPREHENSIVE METABOLIC PANEL
ALT: 21 U/L (ref 5–41)
AST: 25 U/L (ref <40)
Albumin/Globulin Ratio: 1.2 (ref 1.0–2.5)
Albumin: 4.6 g/dL (ref 3.5–5.2)
Alkaline Phosphatase: 106 U/L (ref 40–129)
Anion Gap: 13 mmol/L (ref 9–17)
BUN: 17 mg/dL (ref 8–23)
CO2: 26 mmol/L (ref 20–31)
Calcium: 9.7 mg/dL (ref 8.6–10.4)
Chloride: 101 mmol/L (ref 98–107)
Creatinine: 1.13 mg/dL (ref 0.70–1.20)
GFR African American: >60 mL/min (ref >60)
GFR Non-African American: >60 mL/min (ref >60)
Glucose: 108 mg/dL — ABNORMAL HIGH (ref 70–99)
Potassium: 4.1 mmol/L (ref 3.7–5.3)
Sodium: 140 mmol/L (ref 135–144)
Total Bilirubin: 0.51 mg/dL (ref 0.3–1.2)
Total Protein: 8.3 g/dL (ref 6.4–8.3)

## 2018-10-22 LAB — PROTEIN / CREATININE RATIO, URINE
Creatinine, Ur: 102.7 mg/dL (ref 39.0–259.0)
Total Protein, Urine: 15 mg/dL
Urine Total Protein Creatinine Ratio: 0.15 (ref 0.00–0.20)

## 2018-10-22 LAB — CBC WITH AUTO DIFFERENTIAL
Basophils %: 1 % (ref 0–2)
Basophils Absolute: 0.03 10*3/uL (ref 0.00–0.20)
Eosinophils %: 2 % (ref 1–4)
Eosinophils Absolute: 0.09 10*3/uL (ref 0.00–0.44)
Hematocrit: 48.1 % (ref 40.7–50.3)
Hemoglobin: 15.8 g/dL (ref 13.0–17.0)
Immature Granulocytes %: 0 %
Immature Granulocytes Absolute: <0.03 10*3/uL (ref 0.00–0.30)
Lymphocytes Absolute: 2.44 10*3/uL (ref 1.10–3.70)
Lymphocytes: 43 % (ref 24–43)
MCH: 30.4 pg (ref 25.2–33.5)
MCHC: 32.8 g/dL (ref 28.4–34.8)
MCV: 92.5 fL (ref 82.6–102.9)
MPV: 9.2 fL (ref 8.1–13.5)
Monocytes %: 8 % (ref 3–12)
Monocytes Absolute: 0.44 10*3/uL (ref 0.10–1.20)
NRBC Automated: 0 per 100 WBC
Neutrophils Absolute: 2.62 10*3/uL (ref 1.50–8.10)
Platelets: 281 10*3/uL (ref 138–453)
RBC: 5.2 m/uL (ref 4.21–5.77)
RDW: 15.5 % — ABNORMAL HIGH (ref 11.8–14.4)
Seg Neutrophils: 46 % (ref 36–65)
WBC: 5.6 10*3/uL (ref 3.5–11.3)

## 2018-10-22 LAB — HIV SCREEN: HIV Ag/Ab: NONREACTIVE

## 2018-10-22 LAB — HEMOGLOBIN A1C
Estimated Avg Glucose: 143 mg/dL
Hemoglobin A1C: 6.6 % — ABNORMAL HIGH (ref 4.0–6.0)

## 2018-10-22 LAB — LIPID PANEL
Chol/HDL Ratio: 2.7 (ref <5)
Cholesterol: 184 mg/dL (ref <200)
HDL: 67 mg/dL (ref >40)
LDL Cholesterol: 104 mg/dL (ref 0–130)
Triglycerides: 63 mg/dL (ref <150)

## 2018-10-22 LAB — HEPATITIS C ANTIBODY: Hepatitis C Ab: NONREACTIVE

## 2018-10-23 ENCOUNTER — Encounter: Primary: Internal Medicine

## 2018-11-03 NOTE — Telephone Encounter (Signed)
Please review patients most recent labs & advise.

## 2018-11-04 NOTE — Telephone Encounter (Signed)
Will discuss labs in next appointment

## 2018-11-05 NOTE — Telephone Encounter (Signed)
No, no need to change the appointment , but if the patient has specific questions that he wants to discuss with me that would need an appointment   No abnormal results noted that need an urgent action

## 2018-11-05 NOTE — Telephone Encounter (Signed)
PC to pt-- phone just rings unable to LM

## 2018-11-05 NOTE — Telephone Encounter (Signed)
Pt appt is not for another 2 weeks. Should I make a sooner appt for him?

## 2018-11-09 NOTE — Telephone Encounter (Signed)
PC to pt-- Phone just rings-- will mail letter to contact office in regards to results

## 2018-11-19 ENCOUNTER — Encounter: Attending: Internal Medicine | Primary: Internal Medicine

## 2018-12-14 ENCOUNTER — Telehealth
Admit: 2018-12-14 | Discharge: 2018-12-14 | Payer: PRIVATE HEALTH INSURANCE | Attending: Internal Medicine | Primary: Internal Medicine

## 2018-12-14 DIAGNOSIS — E118 Type 2 diabetes mellitus with unspecified complications: Secondary | ICD-10-CM

## 2018-12-14 NOTE — Patient Instructions (Signed)
Referral for podiatry sent to [facility]  they will call pt for appt, copy of referral with number and address mailed to pt. Pt should call referral number if not heard from within a couple of weeks.  Script for lab mailed to pt, no fasting required. Pt will get labs done with in 3 months.  Pt was added to wait list for 5 months  An After Visit Summary was printed and mailed to the patient.  CB

## 2018-12-14 NOTE — Progress Notes (Signed)
12/14/2018    TELEHEALTH EVALUATION -- Audio(During XWRUE-45 public health emergency)    Patient is evaluated via telephone on 04/21/2018.       Consent:  The patient and/or health care decision maker is aware that that he may receive a bill for this telephone service, depending on his insurance coverage, and has provided verbal consent to proceed: Yes    HPI:    Adam Cochran (DOB:  1955/04/21) has requested an audio evaluation for the following concern(s):    Hypertension     He does not check his blood pressure at home   He missed his appointment for the nurse visit for bp check as well   He states that he is compliant with taking his medications     Review of Systems    Prior to Visit Medications    Medication Sig Taking? Authorizing Provider   amLODIPine (NORVASC) 10 MG tablet Take 1 tablet by mouth daily Yes Rayona Sardinha, MD   losartan-hydroCHLOROthiazide (HYZAAR) 100-25 MG per tablet Take 1 tablet by mouth daily Yes Wendy Poet, MD       Social History     Tobacco Use   ??? Smoking status: Current Some Day Smoker     Packs/day: 0.05     Years: 40.00     Pack years: 2.00     Types: Cigars   ??? Smokeless tobacco: Former Systems developer     Types: Roby date: 10/21/1998   Substance Use Topics   ??? Alcohol use: No   ??? Drug use: Yes     Types: Marijuana            RECORD REVIEW: Previous medical records were reviewed at today's visit.    PHYSICAL EXAMINATION:    Vital Signs: (As obtained by patient/caregiver at home)      Constitutional: []  Appears well-developed and well-nourished []  No apparent distress      []  Abnormal   Mental status  []  Alert and awake  []  Oriented to person/place/time [] Able to follow commands        Pulmonary/Chest: []  Respiratory effort normal.  []  No visualized signs of difficulty breathing or respiratory distress        []  Abnormal              Psychiatric:       []  Normal []  Abnormal        []  No Hallucinations    Other pertinent observable physical exam findings:-            RECORD REVIEW:  Previous medical records were reviewed at today's visit.     The past family, medical and social histories were reviewed and unchanged with the exceptions of what is mentioned in this note.    Due to this being a TeleHealth encounter, evaluation of the following organ systems is limited: Vitals/Constitutional/EENT/Resp/CV/GI/GU/MS/Neuro/Skin/Heme-Lymph-Imm.    ASSESSMENT/PLAN:   Diagnosis Orders   1. Controlled type 2 diabetes mellitus with complication, without long-term current use of insulin ([place])  Microalbumin, Ur    [facility]   2. Uncontrolled hypertension       Plan :   Advised compliance   Spent near 25 min talking about hypertension and DM  Life style modification   Low salt low sugar diet   Rtc in 1 month      Total Time spent 25 min     No follow-ups on file.    An  electronic signature was  used to authenticate this note.    --Tanja Port, MD on 12/14/2018 at 10:43 AM    9}    Pursuant to the emergency declaration under the [facility] and the [employer], 1135 waiver authority and the [employer] Act, this Virtual  Visit was conducted, with patient's consent, to reduce the patient's risk of exposure to COVID-19 and provide continuity of care for an established patient.    Services were provided through a telephone discussion virtually to substitute for in-person clinic visit.

## 2018-12-23 ENCOUNTER — Ambulatory Visit
Admit: 2018-12-23 | Discharge: 2018-12-23 | Payer: PRIVATE HEALTH INSURANCE | Attending: Podiatrist | Primary: Internal Medicine

## 2018-12-23 DIAGNOSIS — B351 Tinea unguium: Secondary | ICD-10-CM

## 2018-12-23 NOTE — Progress Notes (Signed)
[facility]  [facility]  [address]  [address]  [place] [address]  Dept: [phone]  Dept Fax: [phone]    DIABETIC PROGRESS NOTE  Date of patient's visit: 12/23/2018  Patient's Name:  Adam Cochran Date of Birth: Apr 03, 1955            Patient Care Team:  Tanja Port, MD as PCP - General (Internal Medicine)  Tanja Port, MD as PCP - [facility] Empaneled Provider          Chief Complaint   Patient presents with   ??? New Patient   ??? Diabetes   ??? Foot Pain   ??? Nail Problem       Subjective:   Adam Cochran comes to clinic for New Patient; Diabetes; Foot Pain; and Nail Problem    he is a diabetic and states that  He checks sugar regularly.  Pt currently has complaint of thickened, elongated nails that they cannot manage by themselves.  He also c/o painful skin lesion to bil feet. Pt's primary care physician is Tanja Port, MD last seen 12/14/18   Pt's last blood sugar was 108 .     Lab Results   Component Value Date    LABA1C 6.6 (H) 10/22/2018      Complains of numbness in the feet bilat.  Past Medical History:   Diagnosis Date   ??? Hypertension        No Known Allergies  Current Outpatient Medications on File Prior to Visit   Medication Sig Dispense Refill   ??? amLODIPine (NORVASC) 10 MG tablet Take 1 tablet by mouth daily 30 tablet 11   ??? losartan-hydroCHLOROthiazide (HYZAAR) 100-25 MG per tablet Take 1 tablet by mouth daily 30 tablet 3     No current facility-administered medications on file prior to visit.        Review of Systems    Review of Systems:   History obtained from chart review and the patient  General ROS: negative for - chills, fatigue, fever, night sweats or weight gain  Constitutional: Negative for chills, diaphoresis, fatigue, fever and unexpected weight change.  Musculoskeletal: Positive for arthralgias, gait problem and joint swelling.  Neurological ROS: negative for - behavioral changes, confusion, headaches or seizures. Negative for weakness and numbness.    Dermatological ROS: negative for - mole changes, rash  Cardiovascular: Negative for leg swelling.   Gastrointestinal: Negative for constipation, diarrhea, nausea and vomiting.        Objective:  Dermatologic Exam:  Soft tissue lesion to the plantar right and left foot x 5 with central core and petechiae. Pain on palpation of lesion.    Skin No rashes or nodules noted..   Skin is thin, with flaky sloughing skin as well as decreased hair growth to the lower leg  Small red hemosiderin deposits seen dorsal foot   Musculoskeletal:     1st MPJ ROM decreased, Bilateral.  Muscle strength 5/5, Bilateral.  Pain present upon palpation of toenails 1-5, Bilateral. decreased medial longitudinal arch, Bilateral.  Ankle ROM decreased,Bilateral.    Dorsally contracted digits present digits 2, Bilateral.     Vascular: DP pulses 1/4 bilateral.  PT pulses 0/4 bilateral.   CFT <5 seconds, Bilateral.  Hair growth absent to the level of the digits, Bilateral.  Edema present, Bilateral.  Varicosities absent, Bilateral. Erythema absent, Bilateral    Neurological: Sensation diminshed to light touch to level of digits, Bilateral.  Protective sensation intact 6/10 sites via 5.07/10g  Semmes-Weinstein Monofilament, Bilateral.  negative Tinel's, Bilateral.  negative Valleix sign, Bilateral.      Integument: Warm, dry, supple, Bilateral.  Open lesion absent, Bilateral.  Interdigital maceration absent to web spaces 4, Bilateral.  Nails 1-5 left and 1-5 right thickened > 3.0 mm, dystrophic and crumbly, discolored with subungual debris.  Fissures absent, Bilateral.   General: AAO x 3 in NAD.    Derm  Toenail Description  Sites of Onychomycosis Involvement (Check affected area)  [x]  [x]  [x]  [x]  [x]  [x]  [x]  [x]  [x]  [x]   5 4 3 2 1 1 2 3 4 5                           Right                                        Left    Thickness  [x]  [x]  [x]  [x]  [x]  [x]  [x]  [x]  [x]  [x]   5 4 3 2 1 1 2 3 4 5                          Right                                         Left    Dystrophic Changes   [x]  [x]  [x]  [x]  [x]  [x]  [x]  [x]  [x]  [x]   5 4 3 2 1 1 2 3 4 5                          Right                                        Left    Color   [x]  [x]  [x]  [x]  [x]  [x]  [x]  [x]  [x]  [x]   5 4 3 2 1 1 2 3 4 5                           Right                                        Left    Incurvation/Ingrowin   []  []  []  []  []  []  []  []  []  []   5 4 3 2 1 1 2 3 4 5                          Right                                        Left    Inflammation/Pain   [x]  [x]  [x]  [x]  [x]  [x]  [x]  [x]  [x]  [x]   5 4 3 2 1 1 2 3 4 5                          Right  Left        Visual inspection:  Deformity: hammertoe deformity bil feet  amputation: absent  Skin lesions: as above  Edema: right- 2+ pitting edema, left- 2+ pitting edema    Sensory exam:  Monofilament sensation: abnormal - 6/10 via SW 5.07/10g monofilament to the plantar foot bilateral feet    Pulses: abnormal - 1/4 dorsalis pedis pulse and 1/4 Posterior tibial pulse,   Pinprick: Impaired  Proprioception: Impaired  Vibration (128 Hz): Impaired       DM with PVD       [x] Yes    [] No      Assessment:  63 y.o. male with:   Diagnosis Orders   1. Dermatophytosis of nail  [facility] DIABETES FOOT EXAM    11721 - PR DEBRIDEMENT OF NAILS, 6 OR MORE   2. Benign neoplasm of skin of right foot  [facility] DIABETES FOOT EXAM    17110 - PR DESTRUCTION BENIGN LESIONS UP TO 14   3. Benign neoplasm of skin of left foot  [facility] DIABETES FOOT EXAM    [address]110 - PR DESTRUCTION BENIGN LESIONS UP TO 14   4. PVD (peripheral vascular disease) (HCC)  [facility] DIABETES FOOT EXAM    11721 - PR DEBRIDEMENT OF NAILS, 6 OR MORE   5. Pain in both lower extremities  [facility] DIABETES FOOT EXAM    11721 - PR DEBRIDEMENT OF NAILS, 6 OR MORE    [address]110 - PR DESTRUCTION BENIGN LESIONS UP TO 14           Q7   [] Yes    [] No                Q8   [x] Yes    [] No                     Q9   [] Yes    [] No    Plan:   Pt was evaluated and examined. Patient was given personalized discharge  instructions.    The lesion was partially excised and Pyrogallic acid was applied under occlusion. The patient will leave in place for 24-48 hours and than remove. The patient tolerated the procedure well and without complication.    Nails 1-10 were debrided sharply in length and thickness with a nipper and grinder, without incident. Pt will follow up in 9 weeks or sooner if any problems arise. Diagnosis was discussed with the pt and all of their questions were answered in detail. Proper foot hygiene and care was discussed with the pt. Informed patient on proper foot care .  Patient to check feet daily and contact the office with any questions/problems/concerns.   Other comorbidity noted and will be managed by PCP.  12/23/2018    Electronically signed by Arley PhenixKim Kavina Cantave, DPM on 12/23/2018 at 7:56 AM  12/23/2018

## 2019-01-07 NOTE — Telephone Encounter (Signed)
Fax received that pt has not returned cologuard test. PC to pt to see if pt received test and if pt had any questions or concerns about the test. PC to pt; LM on VM for pt to call office back.

## 2019-01-25 ENCOUNTER — Telehealth

## 2019-01-25 NOTE — Telephone Encounter (Signed)
Patient requesting a medication refill.YES  Medication: AMLODIPINE AND LOSARTAN-HYDROCHLOR  Pharmacy: [facility] in [place] main campus pharmacy [phone] push [phone] to pharmacy   Last office visit: na  Next office visit: Visit date not found    Patient needs meds asap been out for a week. [facility] told[facility] no [facility] can fill with [facility] insurance. Needs to go to above pharmacy.callwife if any questions [phone]

## 2019-01-26 MED ORDER — AMLODIPINE BESYLATE 10 MG PO TABS
10 MG | ORAL_TABLET | Freq: Every day | ORAL | 11 refills | Status: DC
Start: 2019-01-26 — End: 2020-02-22

## 2019-01-26 MED ORDER — LOSARTAN POTASSIUM-HCTZ 100-25 MG PO TABS
100-25 MG | ORAL_TABLET | Freq: Every day | ORAL | 3 refills | Status: DC
Start: 2019-01-26 — End: 2019-04-27

## 2019-02-24 ENCOUNTER — Encounter: Payer: PRIVATE HEALTH INSURANCE | Attending: Podiatrist | Primary: Internal Medicine

## 2019-02-25 ENCOUNTER — Encounter: Attending: Internal Medicine | Primary: Internal Medicine

## 2019-02-25 NOTE — Telephone Encounter (Signed)
PC to patient - LM for patient to contact office to schedule appt.

## 2019-02-25 NOTE — Telephone Encounter (Signed)
Pt's wife called in to cancel appt for 2/11 at 11:20. Pt needs to reschedule for the next available AM appt but nothing showed up until April. Please help schedule and call wife to advise. Appt notes should be <OV pt c/o left shoulder pain- screened gree tt pt wife Suzett>

## 2019-02-26 NOTE — Telephone Encounter (Signed)
PC to pt-- Lm to contact clinic

## 2019-03-08 NOTE — Telephone Encounter (Signed)
Fax received that pt has not returned cologuard test. PC to pt to see if pt received test and if pt had any questions or concerns about the test. PC to pt; Unable to LM, Letter Sent to Pt to contact office. If pt needs a new test Please call [phone] to get a new one sent to them.

## 2019-03-31 ENCOUNTER — Ambulatory Visit
Admit: 2019-03-31 | Discharge: 2019-03-31 | Payer: PRIVATE HEALTH INSURANCE | Attending: Student in an Organized Health Care Education/Training Program | Primary: Internal Medicine

## 2019-03-31 DIAGNOSIS — I10 Essential (primary) hypertension: Secondary | ICD-10-CM

## 2019-03-31 LAB — POCT GLYCOSYLATED HEMOGLOBIN (HGB A1C): Hemoglobin A1C: 6.2 %

## 2019-03-31 MED ORDER — CARVEDILOL 6.25 MG PO TABS
6.25 MG | ORAL_TABLET | Freq: Two times a day (BID) | ORAL | 1 refills | Status: DC
Start: 2019-03-31 — End: 2019-07-05

## 2019-03-31 NOTE — Progress Notes (Signed)
[facility]  [address]  [place] [address]  Dept: [phone]  Dept Fax: [phone]    Office Progress/ note  Date ofpatient's visit: 03/31/2019  Patient's Name:  Adam Cochran Date of Birth: [birth year]            Patient Care Team:  Wendy Poet, MD as PCP - General (Internal Medicine)  Wendy Poet, MD as PCP - [facility] Empaneled Provider  Areta Haber, DPM as Physician (Podiatry)  ================================================================    REASON FOR VISIT/CHIEF COMPLAINT:  Pain (left shoulder) and Health Maintenance (pt has eye exam done 3 months ago, pt completed colon cancer screen)    HISTORY OF PRESENTING ILLNESS:  History was obtained from: patient. Jkwon Treptow a 64 y.o. is here for a follow-up.  Patient is having history of hypertension for which he is taking amlodipine 10 mg daily and Hyzaar 100-25 mg daily.  According to the patient he is checking his blood pressure daily at home and the systolic blood pressure is usually in the 160s-170s.  Patient denies any headache, blurred vision, chest pain, palpitations, shortness of breath, nausea, vomiting.  Patient is compliant with his medications.  Patient was also recently diagnosed with type 2 diabetes mellitus with HbA1c of 6.6 on 10/22/2018. patient was advised lifestyle modifications but currently not on any medications.   Patient is following with podiatry team because of the calluses on his feet.  His next appointment is in next month.  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  Patient is complaining of pain but no weakness.  Motor examination is completely normal in both upper limbs.  Patient was screened for HIV and hepatitis C and both of them are negative.  Patient have sent a Cologuard for testing but we do not have the results at the moment    There is no problem list on file for this patient.      Health  Maintenance Due   Topic Date Due   ??? Diabetic foot exam  Never done   ??? Diabetic retinal exam  Never done   ??? COVID-19 Vaccine (1) Never done   ??? Diabetic microalbuminuria test  Never done   ??? Colon cancer screen colonoscopy  Never done       No Known Allergies      Current Outpatient Medications   Medication Sig Dispense Refill   ??? amLODIPine (NORVASC) 10 MG tablet Take 1 tablet by mouth daily 30 tablet 11   ??? losartan-hydroCHLOROthiazide (HYZAAR) 100-25 MG per tablet Take 1 tablet by mouth daily 30 tablet 3     No current facility-administered medications for this visit.        Social History     Tobacco Use   ??? Smoking status: Current Some Day Smoker     Packs/day: 0.05     Years: 40.00     Pack years: 2.00     Types: Cigars   ??? Smokeless tobacco: Former Systems developer     Types: [place] date: 10/21/1998   Substance Use Topics   ??? Alcohol use: No   ??? Drug use: Yes     Types: Marijuana       Family History   Problem Relation Age of Onset   ??? Cancer Mother    ??? No Known Problems Father         REVIEW  OF SYSTEMS:  Review of Systems   Constitutional: Negative for activity change, appetite change, chills, diaphoresis, fatigue, fever and unexpected weight change.   HENT: Negative for congestion.    Eyes: Negative for discharge and itching.   Respiratory: Negative for apnea, cough, choking, chest tightness, shortness of breath, wheezing and stridor.    Cardiovascular: Negative for chest pain, palpitations and leg swelling.   Gastrointestinal: Negative for abdominal distention, abdominal pain, blood in stool, constipation and diarrhea.   Endocrine: Negative for cold intolerance and heat intolerance.   Genitourinary: Negative for difficulty urinating, dysuria, enuresis and flank pain.   Musculoskeletal: Positive for arthralgias. Negative for back pain, gait problem, joint swelling and myalgias.   Allergic/Immunologic: Negative for environmental allergies.   Neurological: Negative for dizziness, tremors, seizures, syncope,  facial asymmetry, speech difficulty, weakness, light-headedness, numbness and headaches.   Hematological: Negative for adenopathy. Does not bruise/bleed easily.   Psychiatric/Behavioral: Negative for agitation, behavioral problems, confusion, decreased concentration, dysphoric mood, hallucinations, self-injury, sleep disturbance and suicidal ideas. The patient is not nervous/anxious and is not hyperactive.      PHYSICAL EXAM:  Vitals:    03/31/19 1019 03/31/19 1023   BP: (!) 162/95 (!) 163/98   Site: Left Upper Arm Left Upper Arm   Position: Sitting Sitting   Cuff Size: Medium Adult Medium Adult   Pulse: 84 86   Weight: 148 lb (67.1 kg)    Height: 5\' 10"  (1.778 m)      BP Readings from Last 3 Encounters:   03/31/19 (!) 163/98   10/21/18 (!) 180/116   03/30/15 (!) 163/90      Physical Exam  Constitutional:       General: He is not in acute distress.     Appearance: Normal appearance. He is normal weight. He is not ill-appearing, toxic-appearing or diaphoretic.   HENT:      Head: Normocephalic and atraumatic.      Right Ear: Tympanic membrane normal.      Left Ear: Tympanic membrane normal.      Nose: Nose normal. No congestion or rhinorrhea.      Mouth/Throat:      Mouth: Mucous membranes are moist.      Pharynx: Oropharynx is clear. No oropharyngeal exudate or posterior oropharyngeal erythema.   Eyes:      Extraocular Movements: Extraocular movements intact.      Conjunctiva/sclera: Conjunctivae normal.      Pupils: Pupils are equal, round, and reactive to light.   Neck:      Musculoskeletal: Normal range of motion. No neck rigidity or muscular tenderness.      Vascular: No carotid bruit.   Cardiovascular:      Rate and Rhythm: Normal rate and regular rhythm.      Heart sounds: No murmur. No friction rub. No gallop.    Pulmonary:      Effort: Pulmonary effort is normal. No respiratory distress.      Breath sounds: Normal breath sounds. No stridor. No wheezing, rhonchi or rales.   Chest:      Chest wall: No  tenderness.   Abdominal:      General: Abdomen is flat. Bowel sounds are normal. There is no distension.      Palpations: Abdomen is soft. There is no mass.      Tenderness: There is no right CVA tenderness, left CVA tenderness, guarding or rebound.      Hernia: No hernia is present.   Musculoskeletal: Normal range of motion.  General: No swelling, tenderness, deformity or signs of injury.      Right lower leg: No edema.      Left lower leg: No edema.   Lymphadenopathy:      Cervical: No cervical adenopathy.   Skin:     General: Skin is warm and dry.      Capillary Refill: Capillary refill takes less than 2 seconds.      Coloration: Skin is not jaundiced or pale.      Findings: No bruising, erythema, lesion or rash.   Neurological:      General: No focal deficit present.      Mental Status: He is alert and oriented to person, place, and time. Mental status is at baseline.      Cranial Nerves: No cranial nerve deficit.      Sensory: No sensory deficit.      Motor: No weakness.      Coordination: Coordination normal.      Gait: Gait normal.      Deep Tendon Reflexes: Reflexes normal.   Psychiatric:         Mood and Affect: Mood normal.         Behavior: Behavior normal.         Thought Content: Thought content normal.         Judgment: Judgment normal.         DIAGNOSTIC FINDINGS:  CBC:  Lab Results   Component Value Date    WBC 5.6 10/22/2018    HGB 15.8 10/22/2018    PLT 281 10/22/2018       BMP:    Lab Results   Component Value Date    NA 140 10/22/2018    K 4.1 10/22/2018    CL 101 10/22/2018    CO2 26 10/22/2018    BUN 17 10/22/2018    CREATININE 1.13 10/22/2018    GLUCOSE 108 10/22/2018       HEMOGLOBIN A1C:   Lab Results   Component Value Date    LABA1C 6.6 10/22/2018       FASTING LIPID PANEL:  Lab Results   Component Value Date    CHOL 184 10/22/2018    HDL 67 10/22/2018    TRIG 63 10/22/2018       ASSESSMENT AND PLAN:  Stephon was seen today for pain and health maintenance.    Diagnoses and all orders  for this visit:    Resistant hypertension:  -According to the patient he is checking his blood pressure at home daily and the systolic blood pressure is usually in the 160s-170s  -We will add carvedilol 6.25 mg 1 tablet 2 times daily  -Continue to monitor his blood pressure at home  -Follow-up in 6 weeks time    Type 2 diabetes mellitus without complication, without long-term current use of insulin (HCC):  -Patient last HbA1c was 6.6  -We will check the HbA1c today again  -Advised lifestyle and diet modification and smoking cessation      Tear of left infraspinatus tendon, subsequent encounter:  -Patient have history of tear of left infraspinatus tendon  -Referred to physical therapy at [facility]  -Patient denied to take any painkillers      FOLLOW UP AND INSTRUCTIONS:  Return in about 6 weeks (around 05/12/2019).   Follow-up on the Cologuard results and the next    ?? Maurine Minister received counseling on the following healthy behaviors: nutrition, exercise, medication adherence, tobacco cessation and decrease in  alcohol consumption    ?? Discussed use, benefit, and side effects of prescribed medications.  Barriers to medication compliance addressed.  All patient questions answered.  Pt voiced understanding.    ?? Patient given educational materials - see patient instructions    Jeremy Johann, M.D  Internal Medicine Resident, PGY 1   03/31/2019, 10:39 AM    This note is created with the assistance of a speech-recognition program. While intending to generate a document that actually reflects the content of thevisit, the document can still have some mistakes which may not have been identified and corrected by editing.

## 2019-03-31 NOTE — Patient Instructions (Signed)
Medications e-scribe to pharmacy of pt's choice.     Referral to Physical Therapy was placed, summary of care printed and faxed to office, phone numbers given to the patient, they will contact office for an appt    Return To Clinic 06/03/2019. After Visit Summary  given and reviewed.     It is very important for your care that you keep your appointment. If for some reason you are unable to keep your appointment it is equally important that you call our office at [phone] to cancel your appointment and reschedule. Failure to do so may result in your termination from our practice.    DR

## 2019-03-31 NOTE — Progress Notes (Signed)
Patient comes in for follow-up.  Blood pressure is uncontrolled.  He states compliance with Hyzaar and amlodipine.  He has had uncontrolled hypertension for long time on review of his chart from [facility] as well.  Labs are normal.  He has diabetes with A1c of 6.6.  Currently only on diet changes.  Labs were reviewed.  Patient advised to start Coreg.  Follow-up for recheck in a few weeks.  May need work-up for secondary hypertension if uncontrolled.  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  He has painful range of motion though it is intact.  Previous MRI reviewed.  Cologuard is pending.  Advised to turn it in.  Follow-up in 6 weeks with PCP for blood pressure check.  Attending Physician Statement  I have discussed the care of Adam Cochran, including pertinent history and exam findings,  with the resident. I have reviewed the key elements of all parts of the encounter with the resident.  I agree with the assessment, plan and orders as documented by the resident.  (GE Modifier)

## 2019-04-02 LAB — FECAL DNA COLORECTAL CANCER SCREENING (COLOGUARD): FIT-DNA (Cologuard): POSITIVE — AB

## 2019-04-06 ENCOUNTER — Telehealth

## 2019-04-06 ENCOUNTER — Encounter

## 2019-04-06 NOTE — Telephone Encounter (Signed)
Results of Colorguard are Positive. Referral for GI sent to [facility]. They will contact pt for appt. Copy of Referral mailed out to pt with Number Highlighted. Pt advise to call and schedule appt if not heard by the time they receive referral in mail. PC to pt LM on VM for pt to call Office Back

## 2019-04-06 NOTE — Telephone Encounter (Signed)
LM on VM for pt to call office back

## 2019-04-06 NOTE — Other (Addendum)
[  x] [facility] - [facility] &  Therapy  [address]    [phone]  F: [phone]   []  [facility] - [facility]  Outpatient Rehabilitation &  Therapy  [address]   [address]  P: [phone]  F: [phone]  []  [facility] -  [facility]  Outpatient Rehabilitation &  Therapy  [address]  P: [phone]  F: [phone] []  [facility]  Outpatient Rehabilitation &  Therapy  [address]  P: [phone]  F: [phone]  []  [facility]  Outpatient Rehabilitation &  Therapy  [address]   [phone]: [phone]  F: [phone]   []  [facility] - [facility] & Therapy  [address]  [phone]: [phone]   F: [phone]     Physical Therapy Cancel/No Show note    Date: 04/06/2019  Patient: Adam Cochran  DOB: 07-19-1955  MRN: [id number]    Cancels/No Shows to date: 0/1    For today's appointment patient:    []   Cancelled    []  Rescheduled appointment    [x]  No-show     Reason given by patient:    []   Patient ill    []   Conflicting appointment    []  No transportation      []  Conflict with work    []  No reason given    []  Weather related    []  COVID-19    []  Other:      Comments:        []  Next appointment was confirmed    Electronically signed by: [phone], PT

## 2019-04-07 ENCOUNTER — Inpatient Hospital Stay: Payer: PRIVATE HEALTH INSURANCE | Primary: Internal Medicine

## 2019-04-08 NOTE — Telephone Encounter (Signed)
LM on VM for pt to call office back

## 2019-04-12 ENCOUNTER — Encounter

## 2019-04-12 MED ORDER — MAGNESIUM CITRATE PO SOLN
Freq: Once | ORAL | 0 refills | Status: DC
Start: 2019-04-12 — End: 2019-04-27

## 2019-04-12 MED ORDER — POLYETHYLENE GLYCOL 3350 17 GM/SCOOP PO POWD
17 GM/SCOOP | ORAL | 0 refills | Status: DC
Start: 2019-04-12 — End: 2019-04-27

## 2019-04-12 MED ORDER — BISACODYL EC 5 MG PO TBEC
5 | ORAL_TABLET | ORAL | 0 refills | Status: DC
Start: 2019-04-12 — End: 2024-01-23

## 2019-04-12 NOTE — Telephone Encounter (Signed)
Rec'd call from pt's wife, Thomasene Lot, wanting to schedule colon. States he recently had (+) cologuard. Pt scheduled for [facility] colon screen, [place], 04/23/19 at 230pm. Miralax/mag given to [facility] over phone and emailed to [email or web address]. Covid 04/19/19 at 720am at [facility]

## 2019-04-13 NOTE — Telephone Encounter (Signed)
Spoke with pt results given. Pt already has an appt for 04/23/19

## 2019-04-13 NOTE — Other (Signed)
[  x] [facility] - [facility] &  Therapy  [address]    [phone]  F: [phone]          Physical Therapy Cancel/No Show note    Date: 04/13/2019  Patient: Czar Ysaguirre  DOB: 1955/11/07  MRN: [id number]    Cancels/No Shows to date: 0/2    For today's appointment patient:    []   Cancelled    []  Rescheduled appointment    [x]  No-show     Reason given by patient:    []   Patient ill    []   Conflicting appointment    []  No transportation      []  Conflict with work    []  No reason given    []  Weather related    []  COVID-19    [x]  Other:      Comments: Pt No-Showed for initial evaluation for Physical Therapy for L Shoulder.  Receive VM from Pt's wife 30 min after appt time stating car wouldn't start.           []  Next appointment was confirmed    Electronically signed by: , PT

## 2019-04-14 ENCOUNTER — Inpatient Hospital Stay: Payer: PRIVATE HEALTH INSURANCE | Primary: Internal Medicine

## 2019-04-16 NOTE — Telephone Encounter (Signed)
Patient's wife left a message that they did not receive prep instructions for an upcoming procedure. Writer left message informing wife Adam Cochran that the instructions were emailed again today to [email or web address]. Writer advised them to call the office if they do not receive them.

## 2019-04-19 ENCOUNTER — Inpatient Hospital Stay: Admit: 2019-04-15 | Payer: PRIVATE HEALTH INSURANCE | Primary: Internal Medicine

## 2019-04-19 DIAGNOSIS — Z01812 Encounter for preprocedural laboratory examination: Secondary | ICD-10-CM

## 2019-04-19 LAB — COVID-19: SARS-CoV-2: NOT DETECTED

## 2019-04-20 NOTE — Telephone Encounter (Signed)
PC from patient asking for FMLA forms to be completed, would you like him to schedule an appointment or can he drop forms off?

## 2019-04-20 NOTE — Telephone Encounter (Signed)
Patient was called and notified of negative Covid-19 test result from [id number]

## 2019-04-22 NOTE — Telephone Encounter (Signed)
Needs an office appointment not a telephone appointment

## 2019-04-23 ENCOUNTER — Inpatient Hospital Stay: Payer: PRIVATE HEALTH INSURANCE

## 2019-04-23 MED ORDER — SODIUM CHLORIDE 0.9 % IV SOLN
0.9 | INTRAVENOUS | Status: DC | PRN
Start: 2019-04-23 — End: 2019-04-23

## 2019-04-23 MED ORDER — PROPOFOL 200 MG/20ML IV EMUL
200 | INTRAVENOUS | Status: AC
Start: 2019-04-23 — End: 2019-04-23

## 2019-04-23 MED ORDER — LIDOCAINE HCL (PF) 2 % IJ SOLN
2 | INTRAMUSCULAR | Status: AC
Start: 2019-04-23 — End: 2019-04-23

## 2019-04-23 MED ORDER — LACTATED RINGERS IV SOLN
INTRAVENOUS | Status: DC
Start: 2019-04-23 — End: 2019-04-23

## 2019-04-23 MED ORDER — NORMAL SALINE FLUSH 0.9 % IV SOLN
0.9 | Freq: Two times a day (BID) | INTRAVENOUS | Status: DC
Start: 2019-04-23 — End: 2019-04-23

## 2019-04-23 MED ORDER — NORMAL SALINE FLUSH 0.9 % IV SOLN
0.9 | INTRAVENOUS | Status: DC | PRN
Start: 2019-04-23 — End: 2019-04-23

## 2019-04-23 MED ORDER — LACTATED RINGERS IV SOLN
INTRAVENOUS | Status: DC | PRN
Start: 2019-04-23 — End: 2019-04-23
  Administered 2019-04-23: 19:00:00 via INTRAVENOUS

## 2019-04-23 MED ORDER — LABETALOL HCL 5 MG/ML IV SOLN
5 MG/ML | INTRAVENOUS | Status: DC | PRN
Start: 2019-04-23 — End: 2019-04-23
  Administered 2019-04-23: 19:00:00 5 via INTRAVENOUS
  Administered 2019-04-23: 19:00:00 10 via INTRAVENOUS

## 2019-04-23 MED ORDER — PROPOFOL 200 MG/20ML IV EMUL
200 | INTRAVENOUS | Status: DC | PRN
Start: 2019-04-23 — End: 2019-04-23
  Administered 2019-04-23: 19:00:00 200 via INTRAVENOUS

## 2019-04-23 MED ORDER — LIDOCAINE HCL (PF) 2 % IJ SOLN
2 % | INTRAMUSCULAR | Status: DC | PRN
Start: 2019-04-23 — End: 2019-04-23
  Administered 2019-04-23: 19:00:00 100 via INTRAVENOUS

## 2019-04-23 MED ORDER — LIDOCAINE HCL (PF) 1 % IJ SOLN
1 % | Freq: Once | INTRAMUSCULAR | Status: DC | PRN
Start: 2019-04-23 — End: 2019-04-23

## 2019-04-23 MED ORDER — SODIUM CHLORIDE 0.9 % IV SOLN
0.9 % | INTRAVENOUS | Status: DC
Start: 2019-04-23 — End: 2019-04-23

## 2019-04-23 MED ORDER — LABETALOL HCL 5 MG/ML IV SOLN
5 | INTRAVENOUS | Status: AC
Start: 2019-04-23 — End: 2019-04-23

## 2019-04-23 MED FILL — LABETALOL HCL 5 MG/ML IV SOLN: 5 mg/mL | INTRAVENOUS | Qty: 20

## 2019-04-23 MED FILL — XYLOCAINE-MPF 2 % IJ SOLN: 2 % | INTRAMUSCULAR | Qty: 5

## 2019-04-23 MED FILL — DIPRIVAN 200 MG/20ML IV EMUL: 200 MG/20ML | INTRAVENOUS | Qty: 40

## 2019-04-23 NOTE — Anesthesia Post-Procedure Evaluation (Signed)
Department of Anesthesiology  Postprocedure Note    Patient: Adam Cochran  MRN: [id number]  Mutual: 08-16-1955  Date of evaluation: 04/23/2019  Time:  3:20 PM     Procedure Summary     Date: 04/23/19 Room / Location: [facility] M1 / [facility]    Anesthesia Start: 1430 Anesthesia Stop: 3748    Procedure: COLONOSCOPY WITH BIOPSY/POLYPECTOMY (N/A ) Diagnosis: (DX SCREENING)    Surgeons: Franchot Heidelberg, MD Responsible Provider: Albin Fischer, MD    Anesthesia Type: MAC ASA Status: 3          Anesthesia Type: MAC    Aldrete Phase I:      Aldrete Phase II: Aldrete Score: 9    Last vitals: Reviewed and per EMR flowsheets.       Anesthesia Post Evaluation    Patient location during evaluation: PACU  Patient participation: complete - patient participated  Level of consciousness: awake  Airway patency: patent  Nausea & Vomiting: no nausea  Complications: no  Cardiovascular status: blood pressure returned to baseline  Respiratory status: acceptable  Hydration status: euvolemic

## 2019-04-23 NOTE — Telephone Encounter (Signed)
Patient's wife called office because she is concerned about her husband's procedure as she has not heard anything. She states she was transferred to [place].  Writer checked [facility] surgery schedule and advised wife that her husband probably just got out of his procedure as it is marked complete but since this is the office its hard to tell.  Patient's wife apologized for calling the office and writer told her it was no problem just that we can only see what is on the schedule.

## 2019-04-23 NOTE — Anesthesia Pre-Procedure Evaluation (Signed)
Department of Anesthesiology  Preprocedure Note       Name:  Adam Cochran   Age:  64 y.o.  DOB:  May 14, 1955                                          MRN:  [id number]         Date:  04/23/2019      Surgeon: Juliann Mule):  Isam Konrad Dolores, MD    Procedure: Procedure(s):  COLORECTAL CANCER SCREENING, NOT HIGH RISK    Medications prior to admission:   Prior to Admission medications    Medication Sig Start Date End Date Taking? Authorizing Provider   polyethylene glycol (GLYCOLAX) 17 GM/SCOOP powder Follow instructions provided to you from physician's office. 04/12/19  Yes Isam Daboul, MD   bisacodyl (BISACODYL) 5 MG EC tablet Follow instructions provided given by the physician's office. 04/12/19  Yes Isam Daboul, MD   magnesium citrate solution Take 296 mLs by mouth once for 1 dose 04/12/19 04/23/19 Yes Isam Daboul, MD   carvedilol (COREG) 6.25 MG tablet Take 1 tablet by mouth 2 times daily 03/31/19   Ardelia Mems, MD   amLODIPine (NORVASC) 10 MG tablet Take 1 tablet by mouth daily 01/26/19   Wendy Poet, MD   losartan-hydroCHLOROthiazide (HYZAAR) 100-25 MG per tablet Take 1 tablet by mouth daily 01/26/19   Wendy Poet, MD       Current medications:    Current Facility-Administered Medications   Medication Dose Route Frequency Provider Last Rate Last Admin   ??? 0.9 % sodium chloride infusion   Intravenous Continuous Ndal Gershon Crane, MD       ??? lactated ringers infusion   Intravenous Continuous Ndal Gershon Crane, MD       ??? sodium chloride flush 0.9 % injection 10 mL  10 mL Intravenous 2 times per day Laveda Norman, MD       ??? sodium chloride flush 0.9 % injection 10 mL  10 mL Intravenous PRN Ndal Gershon Crane, MD       ??? 0.9 % sodium chloride infusion  25 mL Intravenous PRN Ndal Gershon Crane, MD       ??? lidocaine PF 1 % injection 1 mL  1 mL Intradermal Once PRN Ndal Gershon Crane, MD           Allergies:  No Known Allergies    Problem List:    Patient Active Problem List   Diagnosis Code   ??? Resistant hypertension I10       Past Medical History:         Diagnosis Date   ??? Hypertension    ??? Type 2 diabetes mellitus without complication [facility])        Past Surgical History:  No past surgical history on file.    Social History:    Social History     Tobacco Use   ??? Smoking status: Current Some Day Smoker     Packs/day: 0.05     Years: 40.00     Pack years: 2.00     Types: Cigars   ??? Smokeless tobacco: Former Systems developer     Types: [place] date: 10/21/1998   Substance Use Topics   ??? Alcohol use: No  Ready to quit: Not Answered  Counseling given: Not Answered      Vital Signs (Current):   Vitals:    04/23/19 1230 04/23/19 1245   BP: (!) 140/100 (!) 139/97   Pulse: 112    Resp: 16    Temp: 97.9 ??F (36.6 ??C)    TempSrc: Temporal    SpO2: 100%    Weight: 138 lb 12.8 oz (63 kg)    Height: _0  (1.803 m)                                               BP Readings from Last 3 Encounters:   04/23/19 (!) 139/97   03/31/19 (!) 133/99   10/21/18 (!) 180/116       NPO Status: Time of last liquid consumption: 0900                        Time of last solid consumption: 2100                        Date of last liquid consumption: 04/23/19                        Date of last solid food consumption: 04/21/19    BMI:   Wt Readings from Last 3 Encounters:   04/23/19 138 lb 12.8 oz (63 kg)   03/31/19 148 lb (67.1 kg)   12/23/18 155 lb (70.3 kg)     Body mass index is 19.36 kg/m??.    CBC:   Lab Results   Component Value Date    WBC 5.6 10/22/2018    RBC 5.20 10/22/2018    HGB 15.8 10/22/2018    HCT 48.1 10/22/2018    MCV 92.5 10/22/2018    RDW 15.5 10/22/2018    PLT 281 10/22/2018       CMP:   Lab Results   Component Value Date    NA 140 10/22/2018    K 4.1 10/22/2018    CL 101 10/22/2018    CO2 26 10/22/2018    BUN 17 10/22/2018    CREATININE 1.13 10/22/2018    GFRAA >60 10/22/2018    LABGLOM >60 10/22/2018    GLUCOSE 108 10/22/2018    PROT 8.3 10/22/2018    CALCIUM 9.7 10/22/2018    BILITOT 0.51 10/22/2018    ALKPHOS 106 10/22/2018    AST 25 10/22/2018    ALT  21 10/22/2018       POC Tests: No results for input(s): POCGLU, POCNA, POCK, POCCL, POCBUN, POCHEMO, POCHCT in the last 72 hours.    Coags: No results found for: PROTIME, INR, APTT    HCG (If Applicable): No results found for: PREGTESTUR, PREGSERUM, HCG, HCGQUANT     ABGs: No results found for: PHART, PO2ART, PCO2ART, HCO3ART, BEART, O2SATART     Type & Screen (If Applicable):  No results found for: LABABO, LABRH    Drug/Infectious Status (If Applicable):  Lab Results   Component Value Date    HEPCAB NONREACTIVE 10/22/2018       COVID-19 Screening (If Applicable):   Lab Results   Component Value Date    COVID19 Not Detected 04/19/2019           Anesthesia Evaluation  Patient summary reviewed  Airway: Mallampati: II  TM distance: >3 FB   Neck ROM: full  Mouth opening: > = 3 FB Dental: normal exam         Pulmonary:   (+) current smoker                           Cardiovascular:Negative CV ROS  Exercise tolerance: good (>4 METS),   (+) hypertension: moderate,                   Neuro/Psych:   Negative Neuro/Psych ROS              GI/Hepatic/Renal: Neg GI/Hepatic/Renal ROS            Endo/Other:    (+) DiabetesType II DM, , .                  ROS comment: Diet-controlled Abdominal:           Vascular:                                        Anesthesia Plan      MAC     ASA 3       Induction: intravenous.      Anesthetic plan and risks discussed with patient.                      Albin Fischer, MD   04/23/2019

## 2019-04-23 NOTE — Op Note (Signed)
PROCEDURE NOTE    DATE OF PROCEDURE: 04/23/2019    SURGEON: Franchot Heidelberg, MD  Facility : [facility]  ASSISTANT: None  Anesthesia: MAC   PREOPERATIVE DIAGNOSIS:   Screening    POSTOPERATIVE DIAGNOSIS: as described below    OPERATION: Total colonoscopy     ANESTHESIA: Moderate Sedation    ESTIMATED BLOOD LOSS: less than 50     COMPLICATIONS: None.     SPECIMENS:  Was Obtained:   In the sigmoid colon 1 large polyp measuring around 1 cm with the long stalk I snared and retrieved it    Multiple small rectosigmoid tiny polyps sessile removed them with a cold biopsy        HISTORY: The patient is a 64 y.o. year old male with history of above preop diagnosis.  I recommended colonoscopy with possible biopsy or polypectomy and I explained the risk, benefits, expected outcome, and alternatives to the procedure.  Risks included but are not limited to bleeding, infection, respiratory distress, hypotension, and perforation of the colon and possibility of missing a lesion.  The patient understands and is in agreement.        The patient was counseled at length about the risks of contracting Covid-19 during their perioperative period and any recovery window from their procedure.  The patient was made aware that contracting Covid-19  may worsen their prognosis for recovering from their procedure  and lend to a higher morbidity and/or mortality risk.  All material risks, benefits, and reasonable alternatives including postponing the procedure were discussed. The patient does wish to proceed with the procedure at this time.       PROCEDURE: The patient was given IV conscious sedation.  The patient's SPO2 remained above 90% throughout the procedure.     The colonoscope was inserted per rectum and advanced under direct vision to the cecum without difficulty.      Post sedation note :The patient's SPO2 remained above 90% throughout the procedure.the vital signs remained stable , and no immediate complication form the procedure noted,  patient will be ready for d/c when criteria is met .        The prep was good.      Findings:  Terminal ileum: normal    Cecum/Ascending colon: normal    Transverse colon: normal    Descending/Sigmoid colon: abnormal: In the sigmoid colon 1 large polyp measuring around 1 cm with the long stalk I snared and retrieved it    Multiple small rectosigmoid tiny polyps sessile removed them with a cold biopsy  Rare diverticula    Rectum/Anus: examined in normal and retroflexed positions and was abnormal: Hemorrhoids    Withdrawal Time was (minutes): 10    The colon was decompressed and the scope was removed.  The patient tolerated the procedure well.     Recommendations/Plan:   1. Lifestyle and dietary modifications as discussed  2. F/U Biopsies  3. F/U In OfficeYes  4. Discussed with the family  5. Repeat colonoscopy in3years    Electronically signed by Franchot Heidelberg, MD  on 04/23/2019 at 3:06 PM

## 2019-04-23 NOTE — Discharge Instructions (Signed)
PATIENT INSTRUCTIONS  POST-ANESTHESIA    IMMEDIATELY FOLLOWING SURGERY:  Do not drive or operate machinery for the first twenty four hours after surgery.  Do not make any important decisions for twenty four hours after surgery or while taking narcotic pain medications or sedatives.  If you develop intractable nausea and vomiting or a severe headache please notify your doctor immediately. Do not drink alcoholic beverages after anesthesia or while taking pain medications.    FOLLOW-UP:  Please make an appointment with your surgeon as instructed. You do not need to follow up with anesthesia unless specifically instructed to do so.    QUESTIONS?:  Please feel free to call your physician or the hospital operator if you have any questions, and they will be happy to assist you.               Colonoscopy: What to Expect at Home  Your Recovery  After you have a colonoscopy, you will stay at the clinic for 1 to 2 hours until the medicines wear off. Then you can go home, but you will need to arrange for a ride. Your doctor will tell you when you can eat and do your other usual activities.  Your doctor will talk to you about when you will need your next colonoscopy. The results of your test and your risk for colorectal cancer will help your doctor decide how often you need to be checked.  After the test, you may be bloated or have gas pains. You may need to pass gas. If a biopsy was done or a polyp was removed, you may have streaks of blood in your stool (feces) for a few days.  This care sheet gives you a general idea about how long it will take for you to recover. But each person recovers at a different pace. Follow the steps below to get better as quickly as possible.  How can you care for yourself at home?  Activity   Rest as much as you need to after you go home.   You should be able to go back to your usual activities the day after the test.  Diet   Follow your doctor's directions for eating.   Drink plenty of fluids  (unless your doctor has told you not to) to replace the fluids that were lost during the colon prep.   Do not drink alcohol.  Medicines   If polyps were removed or a biopsy was done during the test, your doctor may tell you not to take aspirin or other anti-inflammatory medicines, such as ibuprofen (Advil, Motrin) and naproxen (Aleve), for a few days.  Other instructions   For your safety, you should not drive or operate machinery for 24 hours.   Do not sign legal documents or make major decisions for 24 hours.  Follow-up care is a key part of your treatment and safety. Be sure to make and go to all appointments, and call your doctor if you are having problems. It's also a good idea to know your test results and keep a list of the medicines you take.  When should you call for help?  Call 911 anytime you think you may need emergency care. For example, call if:   You passed out (lost consciousness).   You pass maroon or bloody stools.   You have severe belly pain.  Call your doctor now or seek immediate medical care if:   Your stools are black and tarlike.   Your stools have streaks   of blood, but you did not have a biopsy or any polyps removed.   You have belly pain, or your belly is swollen and firm.   You vomit.   You have a fever.   You are very dizzy.  Watch closely for changes in your health, and be sure to contact your doctor if you have any problems.   Where can you learn more?   Go to [email or web address] and sign in to your MyChart account. Enter E264 in the Search Health Information box to learn more about "Colonoscopy: What to Expect at Home."    If you do not have an account, please click on the "Sign Up Now" link.      2006-2013 [employer]. Care instructions adapted under license by [facility]. This care instruction is for use with your licensed healthcare professional. If you have questions about a medical condition or this instruction, always  ask your healthcare professional. [employer], Inc[employer] disclaims any warranty or liability for your use of this information.  Content Version: 9.9.209917; Last Revised: March 06, 2011

## 2019-04-23 NOTE — H&P (Signed)
History and Physical Update    Pt Name: Adam Cochran  MRN: [id number]  Birthdate: 1955-04-25  Date of evaluation: 04/23/2019      [x]  I have reviewed the Progress Note byDr Jake Michaelis Internal Medicine Resident dated 03/31/19 which meets the criteria for an Interval History and Physical note  And is attached below.    [x]  I have examined  Adam Cochran  There are no changes to the patient who is scheduled for a colorectal cancer screening by Dr Konrad Dolores for colon screening.  Patient followed bowel prep until watery clear.  No previous colonoscopy.  No FH colon cancer or polyps.  Patient denies bowel changes, bloody tarry stools, diarrhea alternating with constipation, abdominal pain or unintentional weight loss.  The patient denies new health changes, fever, chills, wheezing, cough, increased SOB, chest pain, open sores or wounds.    PMH, Surgical History, Social History, Psych, and Family History reviewed and updated in EPIC in appropriate section.     Vital signs: BP (!) 139/97    Pulse 112    Temp 97.9 ??F (36.6 ??C) (Temporal)    Resp 16    Ht 5\' 11"  (1.803 m)    Wt 138 lb 12.8 oz (63 kg)    SpO2 100%    BMI 19.36 kg/m??     Allergies:  Patient has no known allergies.    Medications:    Prior to Admission medications    Medication Sig Start Date End Date Taking? Authorizing Provider   polyethylene glycol (GLYCOLAX) 17 GM/SCOOP powder Follow instructions provided to you from physician's office. 04/12/19  Yes Isam Daboul, MD   bisacodyl (BISACODYL) 5 MG EC tablet Follow instructions provided given by the physician's office. 04/12/19  Yes Isam Daboul, MD   magnesium citrate solution Take 296 mLs by mouth once for 1 dose 04/12/19 04/23/19 Yes Isam Daboul, MD   carvedilol (COREG) 6.25 MG tablet Take 1 tablet by mouth 2 times daily 03/31/19   Ardelia Mems, MD   amLODIPine (NORVASC) 10 MG tablet Take 1 tablet by mouth daily 01/26/19   Wendy Poet, MD   losartan-hydroCHLOROthiazide (HYZAAR) 100-25 MG per tablet Take 1 tablet by mouth  daily 01/26/19   Wendy Poet, MD         This is a 64 y.o. male who is pleasant, cooperative, alert and oriented x3, in no acute distress.    Heart: Heart sounds are normal.  HR 112 asymptomatic tachycardia  Repeat Apical HR 96 regular rate and rhythm without murmur, gallop or rub.   Lungs: Normal respiratory effort with equal expansion, good air exchange, unlabored and clear to auscultation without wheezes or rales bilaterally   Abdomen: soft, nontender, nondistended with bowel sounds .       Labs:  No results for input(s): HGB, HCT, WBC, MCV, PLT, NA, K, CL, CO2, BUN, CREATININE, GLUCOSE, INR, PROTIME, APTT, AST, ALT, LABALBU, HCG in the last 720 hours.    Recent Labs     04/19/19  0720   COVID19        Not Detected       Zissy Hamlett, Delfina Redwood  APRN, ANP-BC  Electronically signed 04/23/2019 at 1:06 PM    Adam Hamper, MD   Resident   Specialty:  Internal Medicine   Progress Notes   Signed   Encounter Date:  03/31/2019         Related encounter: Office Visit from 03/31/2019 in [place]  Signed        Expand AllCollapse All          [facility]  [facility]  [address] [place] [address]  Dept: [phone]  Dept Fax: [phone]     Office Progress/ note  Date ofpatient's visit: 03/31/2019  Patient's Name:  Adam Cochran        Date of Birth: 1955-04-06             Patient Care Team:  Tanja Port, MD as PCP - General (Internal Medicine)  Tanja Port, MD as PCP - [facility] Empaneled Provider  Arley Phenix, DPM as Physician (Podiatry)  ================================================================     REASON FOR VISIT/CHIEF COMPLAINT:  Pain (left shoulder) and Health Maintenance (pt has eye exam done 3 months ago, pt completed colon cancer screen)     HISTORY OF PRESENTING ILLNESS:  History was obtained from: patient. Adam Cochran a 64 y.o. is here for a follow-up.  Patient is having history of hypertension for which he is taking amlodipine 10 mg daily and Hyzaar  100-25 mg daily.  According to the patient he is checking his blood pressure daily at home and the systolic blood pressure is usually in the 160s-170s.  Patient denies any headache, blurred vision, chest pain, palpitations, shortness of breath, nausea, vomiting.  Patient is compliant with his medications.  Patient was also recently diagnosed with type 2 diabetes mellitus with HbA1c of 6.6 on 10/22/2018. patient was advised lifestyle modifications but currently not on any medications.   Patient is following with podiatry team because of the calluses on his feet.  His next appointment is in next month.  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  Patient is complaining of pain but no weakness.  Motor examination is completely normal in both upper limbs.  Patient was screened for HIV and hepatitis C and both of them are negative.  Patient have sent a Cologuard for testing but we do not have the results at the moment     There is no problem list on file for this patient.             Health Maintenance Due   Topic Date Due   ??? Diabetic foot exam  Never done   ??? Diabetic retinal exam  Never done   ??? COVID-19 Vaccine (1) Never done   ??? Diabetic microalbuminuria test  Never done   ??? Colon cancer screen colonoscopy  Never done         No Known Allergies       Current Facility-Administered Medications   Current Outpatient Medications   Medication Sig Dispense Refill   ??? amLODIPine (NORVASC) 10 MG tablet Take 1 tablet by mouth daily 30 tablet 11   ??? losartan-hydroCHLOROthiazide (HYZAAR) 100-25 MG per tablet Take 1 tablet by mouth daily 30 tablet 3      No current facility-administered medications for this visit.             Social History            Tobacco Use   ??? Smoking status: Current Some Day Smoker       Packs/day: 0.05       Years: 40.00       Pack years: 2.00       Types: Cigars   ??? Smokeless tobacco: Former Neurosurgeon  Types: Dorna Bloom        Quit date: 10/21/1998   Substance Use Topics   ??? Alcohol use: No   ??? Drug use: Yes       Types: Marijuana         Family History         Family History   Problem Relation Age of Onset   ??? Cancer Mother     ??? No Known Problems Father              REVIEW OF SYSTEMS:  Review of Systems   Constitutional: Negative for activity change, appetite change, chills, diaphoresis, fatigue, fever and unexpected weight change.   HENT: Negative for congestion.    Eyes: Negative for discharge and itching.   Respiratory: Negative for apnea, cough, choking, chest tightness, shortness of breath, wheezing and stridor.    Cardiovascular: Negative for chest pain, palpitations and leg swelling.   Gastrointestinal: Negative for abdominal distention, abdominal pain, blood in stool, constipation and diarrhea.   Endocrine: Negative for cold intolerance and heat intolerance.   Genitourinary: Negative for difficulty urinating, dysuria, enuresis and flank pain.   Musculoskeletal: Positive for arthralgias. Negative for back pain, gait problem, joint swelling and myalgias.   Allergic/Immunologic: Negative for environmental allergies.   Neurological: Negative for dizziness, tremors, seizures, syncope, facial asymmetry, speech difficulty, weakness, light-headedness, numbness and headaches.   Hematological: Negative for adenopathy. Does not bruise/bleed easily.   Psychiatric/Behavioral: Negative for agitation, behavioral problems, confusion, decreased concentration, dysphoric mood, hallucinations, self-injury, sleep disturbance and suicidal ideas. The patient is not nervous/anxious and is not hyperactive.       PHYSICAL EXAM:  Vitals        Vitals:     03/31/19 1019 03/31/19 1023   BP: (!) 162/95 (!) 163/98   Site: Left Upper Arm Left Upper Arm   Position: Sitting Sitting   Cuff Size: Medium Adult Medium Adult   Pulse: 84 86   Weight: 148 lb (67.1 kg)     Height: 5\' 10"  (1.778 m)               BP Readings from Last 3 Encounters:   03/31/19 (!) 163/98    10/21/18 (!) 180/116   03/30/15 (!) 163/90      Physical Exam  Constitutional:       General: He is not in acute distress.     Appearance: Normal appearance. He is normal weight. He is not ill-appearing, toxic-appearing or diaphoretic.   HENT:      Head: Normocephalic and atraumatic.      Right Ear: Tympanic membrane normal.      Left Ear: Tympanic membrane normal.      Nose: Nose normal. No congestion or rhinorrhea.      Mouth/Throat:      Mouth: Mucous membranes are moist.      Pharynx: Oropharynx is clear. No oropharyngeal exudate or posterior oropharyngeal erythema.   Eyes:      Extraocular Movements: Extraocular movements intact.      Conjunctiva/sclera: Conjunctivae normal.      Pupils: Pupils are equal, round, and reactive to light.   Neck:      Musculoskeletal: Normal range of motion. No neck rigidity or muscular tenderness.      Vascular: No carotid bruit.   Cardiovascular:      Rate and Rhythm: Normal rate and regular rhythm.      Heart sounds: No murmur. No friction rub. No gallop.    Pulmonary:  Effort: Pulmonary effort is normal. No respiratory distress.      Breath sounds: Normal breath sounds. No stridor. No wheezing, rhonchi or rales.   Chest:      Chest wall: No tenderness.   Abdominal:      General: Abdomen is flat. Bowel sounds are normal. There is no distension.      Palpations: Abdomen is soft. There is no mass.      Tenderness: There is no right CVA tenderness, left CVA tenderness, guarding or rebound.      Hernia: No hernia is present.   Musculoskeletal: Normal range of motion.         General: No swelling, tenderness, deformity or signs of injury.      Right lower leg: No edema.      Left lower leg: No edema.   Lymphadenopathy:      Cervical: No cervical adenopathy.   Skin:     General: Skin is warm and dry.      Capillary Refill: Capillary refill takes less than 2 seconds.      Coloration: Skin is not jaundiced or pale.      Findings: No bruising, erythema, lesion or rash.    Neurological:      General: No focal deficit present.      Mental Status: He is alert and oriented to person, place, and time. Mental status is at baseline.      Cranial Nerves: No cranial nerve deficit.      Sensory: No sensory deficit.      Motor: No weakness.      Coordination: Coordination normal.      Gait: Gait normal.      Deep Tendon Reflexes: Reflexes normal.   Psychiatric:         Mood and Affect: Mood normal.         Behavior: Behavior normal.         Thought Content: Thought content normal.         Judgment: Judgment normal.            DIAGNOSTIC FINDINGS:  CBC:        Lab Results   Component Value Date     WBC 5.6 10/22/2018     HGB 15.8 10/22/2018     PLT 281 10/22/2018         BMP:          Lab Results   Component Value Date     NA 140 10/22/2018     K 4.1 10/22/2018     CL 101 10/22/2018     CO2 26 10/22/2018     BUN 17 10/22/2018     CREATININE 1.13 10/22/2018     GLUCOSE 108 10/22/2018         HEMOGLOBIN A1C:         Lab Results   Component Value Date     LABA1C 6.6 10/22/2018         FASTING LIPID PANEL:        Lab Results   Component Value Date     CHOL 184 10/22/2018     HDL 67 10/22/2018     TRIG 63 10/22/2018         ASSESSMENT AND PLAN:  Ranard was seen today for pain and health maintenance.     Diagnoses and all orders for this visit:     Resistant hypertension:  -According to the patient he is checking his blood pressure at home daily  and the systolic blood pressure is usually in the 160s-170s  -We will add carvedilol 6.25 mg 1 tablet 2 times daily  -Continue to monitor his blood pressure at home  -Follow-up in 6 weeks time     Type 2 diabetes mellitus without complication, without long-term current use of insulin (HCC):  -Patient last HbA1c was 6.6  -We will check the HbA1c today again  -Advised lifestyle and diet modification and smoking cessation        Tear of left infraspinatus tendon, subsequent encounter:  -Patient have history of tear of left infraspinatus tendon  -[facility]  -Patient denied to take any painkillers        FOLLOW UP AND INSTRUCTIONS:  ?? Return in about 6 weeks (around 05/12/2019).   ?? Follow-up on the Cologuard results and the next     ?? Adam Cochran received counseling on the following healthy behaviors: nutrition, exercise, medication adherence, tobacco cessation and decrease in alcohol consumption     ?? Discussed use, benefit, and side effects of prescribed medications.  Barriers to medication compliance addressed.  All patient questions answered.  Pt voiced understanding.     ?? Patient given educational materials - see patient instructions     Jeremy JohannAbdul Baqi, M.D  Internal Medicine Resident, PGY 1   03/31/2019, 10:39 AM     This note is created with the assistance of a speech-recognition program. While intending to generate a document that actually reflects the content of thevisit, the document can still have some mistakes which may not have been identified and corrected by editing.

## 2019-04-27 ENCOUNTER — Ambulatory Visit
Admit: 2019-04-27 | Discharge: 2019-04-27 | Payer: PRIVATE HEALTH INSURANCE | Attending: Internal Medicine | Primary: Internal Medicine

## 2019-04-27 ENCOUNTER — Inpatient Hospital Stay: Admit: 2019-04-27 | Discharge: 2019-04-27 | Payer: PRIVATE HEALTH INSURANCE | Primary: Internal Medicine

## 2019-04-27 DIAGNOSIS — S46812D Strain of other muscles, fascia and tendons at shoulder and upper arm level, left arm, subsequent encounter: Secondary | ICD-10-CM

## 2019-04-27 DIAGNOSIS — I10 Essential (primary) hypertension: Secondary | ICD-10-CM

## 2019-04-27 LAB — SURGICAL PATHOLOGY

## 2019-04-27 MED ORDER — HYDROCHLOROTHIAZIDE 25 MG PO TABS
25 MG | ORAL_TABLET | Freq: Every morning | ORAL | 1 refills | Status: DC
Start: 2019-04-27 — End: 2019-08-09

## 2019-04-27 NOTE — Consults (Signed)
[x] [facility]  Therapy  [address]  [phone]  F: [phone] [] [place]  [address]   [address]  P: [phone]  F: [phone] [] [place]  P: [phone]  F: [phone] [] [place]  P: [phone]  F: [phone] [] [place]  [place]   [address]   [place]: [phone]  F: [phone]      Physical Therapy Upper Extremity Evaluation    Date:  04/27/2019  Patient: Adam Cochran  DOB: [birth year]  MRN: [id number]  Physician: Ardelia Mems, MD  Insurance: [facility] BENEFIT ADMINISTRATORS [phone] visits)  Medical Diagnosis:  Tear of left infraspinatus tendon, subsequent encounter (S46.812D [ICD-10-CM])    Rehab Codes: M25.512, M25.612, M62.81  Onset Date: 03/31/19   Next Dr.'s appt:     Subjective:   CC: Constant left shoulder pain; denies referred pain down the UE or numbness and tingling; pt is right-hand dominant; Difficulty reaching overhead especially for a longer period of time; difficulty lifting due to shoulder pain; difficulty laying on his left side due to shoulder pain   HPI: (onset date): Pt states he initially started having pain in the left shoulder back in 2017 secondary to overuse at work (does a lot of lifting and pushing, pulling).  States that he was diagnosed with a partial infraspinatus tear and went through physical therapy which was very helpful.  No surgery recommended.  States that he has always continued to have mild pain but that it has been gradually worsening over time.      PMHx: [] Unremarkable [] Diabetes [] HTN  [] Pacemaker   [] MI/Heart Problems [] Cancer [] Arthritis [] Other:              [x] Refer to full medical chart  In EPIC        Comorbidities:   [] Obesity [] Dialysis  [] N/A   [] Asthma/COPD [] Dementia [] Other:   [] Stroke [] Sleep apnea [] Other:   [] Vascular disease [] Rheumatic disease [] Other:     Tests: [x] X-Ray:    [x] MRI:  [] Other:    Medications: [x] Refer to full medical record [] None [] Other:  Allergies:      [] Refer to full medical record [] None [] Other:    Function:  Hand Dominance  [x] Right  [] Left  Employer [employer]   Job Status []  Normal duty   [] Light duty   [x] Off due to condition    []  Retired   [] Not employed   [] Disability  [] Other:  []  Return to work:   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     Pain:  [x] Yes  [] No Location: Left shoulder Pain Rating: (0-10 scale) 3/10 avg; 6-7/10 at worst  Pain altered Tx:  [] Yes  [x] No  Action:    Symptoms:  [] Improving [x] Worsening [] Same  Better:  [] AM    [] PM    [] Sit    [] Rise/Sit    []  Stand    [] Walk    [] Lying    [] Other:  Worse: [] AM    [] PM    [] Sit    [] Rise/Sit    []Stand    [] Walk    [x] Lying on left side    [] Bend                      [] Valsalva    [x] Other: Holding arm up too long  Sleep: [] OK    [x] Disturbed    Objective:     ROM  ??A/P END FEEL STRENGTH TESTS (+/-) Left Right Not Tested    Left Right  Left Right Drop Arm   []   Sit Shld Flex 130 145  4 5 Sulcus Sign   []   Sit Shld Abd 130 150  4 5 Apprehension   []   Sit Shld IR Mid-thoracic lower thoracic    Yergasons   []   Shoulder Flex 165     Speeds   []   Ext      Neer +  []   ABD 165     Hawkins  +  []   ER @ 45 50   4- 5 Painful Arc   []   IR 50   4 5 Tinel   []   Supraspinatus            Infraspinatus            Serratus Ant            Pectoralis            Lats            Mid Trap            Lower Trap                OBSERVATION No Deficit Deficit Not Tested Comments   Forward Head [] [x] []    Rounded Shoulders [] [x] []    Kyphosis [x] [] []    Scap Height/Position [] [x] []    Winging [x] [] []    SH Rhythm [] [x] []     INSPECTION/PALPATION       SC/AC Joint [x] [] []    Supraspinatus, Infraspinatus [] [x] []    Biceps tendon/groove [x] [] []    Posterior shld [x] [] []    Subscapularis [] [] []    NEUROLOGICAL       Cervical ROM/Quadrant [] [] []    Reflexes [] [] []    Compression/Distraction [] [] []    Sensation [] [] []      Functional Test: UEFI Score: 35% functionally impaired     Comments:    Assessment:  Patient would benefit from skilled physical therapy services in order to: address limited left shoulder ROM both actively and passively, left shoulder weakness (especially the rotator cuff and scapular muscles), postural impairments (rounded shoulders), and functional impairments including difficulty lifting.       Problems:    [x] ? Pain:  [x] ? ROM:  [x] ? Strength:  [x] ? Function:  [] Other:     STG: (to be met in 8 treatments)  1. ? Pain: Decrease left shoulder pain to 1/10 on average, 4/10 at worst  2. ? ROM: Increase left shoulder AROM flexion and abduction to 140??, PROM IR  and ER to 60??  3. ? Strength: Increase left shoulder flex and abd to 4+/5, IR to 4+/5, and ER to 4/5  4. ? Function: Improve UEFI to 25% impairment  5. Patient to be independent with home exercise program as demonstrated by performance with correct form without cues.  LTG: (to be met in 12 treatments)  1. Pt will be able to lift a bag of groceries with little to no difficulty  2. Pt will be able to reach overhead for a period of time with little difficulty  3. Improve UEFI to 15% impairment                   Patient goals:  Lesson pain.    Rehab Potential:  [x] Good  [] Fair  [] Poor   Suggested Professional Referral:  [x] No  [] Yes:  Barriers to Goal Achievement:  [x] No  [] Yes:  Domestic Concerns:  [x] No  [] Yes:    Pt. Education:  [x] Plans/Goals, Risks/Benefits discussed  [x] Home exercise program--Discussed PT POC with the pt, exercises listed below printed on a handout for HEP and given blue tband for at home  Method of Education: [x]  Verbal  [x] Demo  [x] Written  Comprehension of Education:  [x] Verbalizes understanding.  [x] Demonstrates understanding.  [] Needs Review.  [] Demonstrates/verbalizes understanding of HEP/Ed previously given.    Treatment Plan:  [x] Therapeutic Exercise   16384  [] Iontophoresis: 4 mg/mL Dexamethasone Sodium Phosphate 40-120 mAmin  97033   [] Therapeutic Activity  66599 [x] Vasopneumatic cold with compression  97016    [] Gait Training   35701 [] Ultrasound   77939   [] Neuromuscular Re-education  03009 [] Electrical Stimulation Unattended  97014   [x] Manual Therapy  23300 [] Electrical Stimulation Attended  76226   [x] Instruction in HEP  [] Lumbar/Cervical Traction  33354   [] Aquatic Therapy   56256 [x] Cold/hotpack    [] Massage   38937      [] Dry Needling, 1 or 2 muscles  20560   [] Biofeedback, first 15 minutes   34287  [] Biofeedback, additional 15 minutes   68115 [] Dry Needling, 3 or more muscles  20561     []  Medication allergies reviewed for use of    Dexamethasone Sodium Phosphate 64m/ml     with iontophoresis treatments.   Pt is not allergic.       Frequency: 2 x/week for 12 visits    Today???s Treatment:  Modalities:   Precautions:  Exercises:  Exercise Reps/ Time Weight/ Level Comments   Tband:      Lats 15x     Rows 15x     IR 15x     ER 15x     Other:    Specific Instructions for next treatment: Left shoulder stretches for ROM, strengthening (rotator cuff, scapula)      Evaluation Complexity:  History (Personal factors, comorbidities) [] 0 [x] 1-2 [] 3+   Exam (limitations, restrictions) [] 1-2 [x] 3 [] 4+   Clinical presentation (progression) [x] Stable [] Evolving  [] Unstable   Decision Making [x] Low [] Moderate [] High    [x] Low Complexity [] Moderate Complexity [] High Complexity       Treatment Charges: Mins Units   [x] Evaluation       [x]  Low       []  Moderate       []  High 20 1   []  Modalities     [x]  Ther Exercise 15 1   []  Manual Therapy     []  Ther Activities     []  Aquatics      []  Vasocompression     []  Other       TOTAL TREATMENT TIME: 35    Time in: 8:10am     Time Out: 8:50 am    Electronically signed by: Wiliam Ke, PT        Physician Signature:________________________________Date:__________________  By signing above or cosigning this note, I have reviewed this plan of care and certify a need for medically necessary rehabilitation services.     *PLEASE SIGN ABOVE AND FAX BACK ALL PAGES*

## 2019-04-27 NOTE — Patient Instructions (Signed)
Medications e-scribe to pharmacy of pt's choice.   An After Visit Summary was printed and given to the patient.  CB

## 2019-04-27 NOTE — Progress Notes (Signed)
[facility]      Date of Patient's Visit: 04/27/2019    Progress note    Patient Care Team:  Wendy Poet, MD as PCP - General (Internal Medicine)  Wendy Poet, MD as PCP - [facility] Empaneled Provider  Areta Haber, DPM as Physician (Podiatry)      CHIEF COMPLAINT  Chief Complaint   Patient presents with   ??? Forms       SUBJECTIVE  Adam Cochran is a 64 y.o. male who presents for FMLA paper work.   He has some issues with the job and is hoping to get next 3 months off from work   He does not have any major illness except for left shoulder pain, hypertension and diabetes         ROS  All other review of systems negative, except for those noted.    Review of Systems    Past Medical History:   Diagnosis Date   ??? Hypertension    ??? Type 2 diabetes mellitus without complication [facility])        Past Surgical History:   Procedure Laterality Date   ??? COLONOSCOPY N/A 04/23/2019    COLONOSCOPY WITH BIOPSY/POLYPECTOMY performed by Franchot Heidelberg, MD at [place] History   Problem Relation Age of Onset   ??? Cancer Mother    ??? No Known Problems Father        Social History     Socioeconomic History   ??? Marital status: Single     Spouse name: None   ??? Number of children: None   ??? Years of education: None   ??? Highest education level: None   Occupational History   ??? None   Social Needs   ??? Financial resource strain: Not hard at all   ??? Food insecurity     Worry: Never true     Inability: Never true   ??? Transportation needs     Medical: No     Non-medical: No   Tobacco Use   ??? Smoking status: Current Some Day Smoker     Packs/day: 0.05     Years: 40.00     Pack years: 2.00     Types: Cigars   ??? Smokeless tobacco: Former Systems developer     Types: [place] date: 10/21/1998   Substance and Sexual Activity   ??? Alcohol use: No   ??? Drug use: Yes     Types: Marijuana   ??? Sexual activity: Never   Lifestyle   ??? Physical activity     Days per week: None     Minutes per session: None   ??? Stress: None    Relationships   ??? Social Product manager on phone: None     Gets together: None     Attends religious service: None     Active member of club or organization: None     Attends meetings of clubs or organizations: None     Relationship status: None   ??? Intimate partner violence     Fear of current or ex partner: None     Emotionally abused: None     Physically abused: None     Forced sexual activity: None   Other Topics Concern   ??? None   Social History Narrative   ??? None       Current Outpatient Medications   Medication Sig Dispense  Refill   ??? hydroCHLOROthiazide (HYDRODIURIL) 25 MG tablet Take 1 tablet by mouth every morning 90 tablet 1   ??? carvedilol (COREG) 6.25 MG tablet Take 1 tablet by mouth 2 times daily 60 tablet 1   ??? amLODIPine (NORVASC) 10 MG tablet Take 1 tablet by mouth daily 30 tablet 11   ??? bisacodyl (BISACODYL) 5 MG EC tablet Follow instructions provided given by the physician's office. (Patient not taking: Reported on 04/27/2019) 4 tablet 0     No current facility-administered medications for this visit.        No Known Allergies    PHYSICAL EXAM:   Vital Signs:   BP (!) 162/97    Pulse 87    Ht 5\' 10"  (1.778 m)    Wt 146 lb (66.2 kg)    BMI 20.95 kg/m??     BP Readings from Last 3 Encounters:   04/27/19 (!) 162/97   04/23/19 (!) 166/96   04/23/19 114/79        Wt Readings from Last 3 Encounters:   04/27/19 146 lb (66.2 kg)   04/23/19 138 lb 12.8 oz (63 kg)   03/31/19 148 lb (67.1 kg)       Physical Exam  Cardiovascular:      Rate and Rhythm: Normal rate and regular rhythm.      Pulses: Normal pulses.   Pulmonary:      Effort: Pulmonary effort is normal.   Abdominal:      General: Abdomen is flat.   Musculoskeletal: Normal range of motion.   Skin:     General: Skin is warm.   Neurological:      General: No focal deficit present.      Mental Status: He is alert and oriented to person, place, and time.         Body mass index is 20.95 kg/m??.    RESULTS    Lab Findings    CBC:   Lab Results    Component Value Date    WBC 5.6 10/22/2018    HGB 15.8 10/22/2018    PLT 281 10/22/2018     BMP:   Lab Results   Component Value Date    NA 140 10/22/2018    K 4.1 10/22/2018    CL 101 10/22/2018    CO2 26 10/22/2018    BUN 17 10/22/2018    CREATININE 1.13 10/22/2018    GLUCOSE 108 10/22/2018     HEMOGLOBIN A1C:   Lab Results   Component Value Date    LABA1C 6.2 03/31/2019     MICROALBUMIN URINE: No results found for: MICROALBUR  FASTING LIPID PANEL:   Lab Results   Component Value Date    CHOL 184 10/22/2018    HDL 67 10/22/2018    TRIG 63 10/22/2018     Lab Results   Component Value Date    LDLCHOLESTEROL 104 10/22/2018     LIVER PROFILE:   Lab Results   Component Value Date    ALT 21 10/22/2018    AST 25 10/22/2018    PROT 8.3 10/22/2018    BILITOT 0.51 10/22/2018    LABALBU 4.6 10/22/2018      THYROID FUNCTION: No results found for: TSH   URINE ANALYSIS: No results found for: LABURIN      ASSESSMENT & PLAN    1. Uncontrolled hypertension    - hydroCHLOROthiazide (HYDRODIURIL) 25 MG tablet; Take 1 tablet by mouth every morning  Dispense: 90 tablet; Refill: 1  2. Controlled type 2 diabetes mellitus with complication, without long-term current use of insulin (HCC)      Time spent 15 minutes        Follow up Instructions:    Return in about 2 months (around 06/27/2019).    1. Reviewed prior labs and health maintenance.      2. Discussed use, benefit, and side effects of prescribed medications.  Barriers to medication compliance addressed.  All patient questions answered.  Pt voiced understanding.     3. Patient given educational materials - see patient instructions      Tanja Port, MD FASN  Attending Physician, [facility]   Faculty, Internal Medicine Residency Program  [facility] Physician - [facility]  04/27/2019, 3:25 PM

## 2019-04-30 ENCOUNTER — Inpatient Hospital Stay: Admit: 2019-04-30 | Discharge: 2019-04-30 | Payer: PRIVATE HEALTH INSURANCE | Primary: Internal Medicine

## 2019-04-30 NOTE — Other (Signed)
'[x]'  [place]  [address]  [phone]  F: [phone] '[]'  [place]  [address]   [address]  P: [phone]  F: [phone] '[]'  [place]  [facility]  [place]  P: [phone]  F: [phone] '[]'  [place]: [phone]  F: [phone] '[]'  [place]  [place]   [address]   [place]: [phone]  F: [phone]      Physical Therapy Daily Treatment Note    Date:  04/30/2019  Patient Name:  Adam Cochran    DOB:  December 11, 1955  MRN: [id number]   [place] (35/35 visits)  Medical Diagnosis: ??Tear of left infraspinatus tendon, subsequent encounter (L89.211H [ICD-10-CM])                Rehab Codes: M25.512, M25.612, M62.81  Onset Date: 03/31/19               Next Dr.'s appt  Visit# / total visits: 02/12; Progress note for at visit # 8  For STG.      Cancels/No Shows: 0/0    Subjective:    Pain:  '[x]'  Yes  '[]'  No Location: L shldr.  Pain Rating: (0-10 scale) 3/10  Pain altered Tx:  '[x]'  No  '[]'  Yes  Action:  Comments:Reports he was sore after initial PT visit and post HEP so he held off with HEP yesterday.  Objective:  Modalities: vaso compression moderate compression x 10 mins at 38??  Precautions:   Exercises:  Exercise Reps/ Time Weight/ Level Comments   Pulley 2 mils   Flexion, added    UBE 2/2 mins           Standing      Retro shldr rolls 10x  added   Wall pectoralis stretches 3x30"  added   Post. Capsule stretch 3x15"  added         Prone TG  L30 Added all 4/16   scap depression 10x A    shldr ext  15x A    rows 15x A     abd 10x A          Supine   Added all 4/16   Wand ER 10x A    Wand ER stretch 3x15"     Manual x  Distraction, PROM flexion, abd, ER.         Tband: ?? ?? 10x each 4/16   Lats 15x  blue ??   Rows 15x blue ??   IR 15x blue ??w/ towel roll   ER 15x blue ??w/ towel roll   Other:  ??  Specific Instructions for next treatment: Left shoulder stretches for ROM, strengthening (rotator cuff, and  scapula)         Treatment Charges: Mins Units   '[]'   Modalities     '[x]'   Ther Exercise 34 2   '[]'   Manual Therapy     '[]'   Ther Activities     '[]'   Aquatics     '[x]'   Vasocompression 10 1   '[]'   Other     Total Treatment time  44 3       Assessment: '[x]'   Progressing toward goals added prone periscapular strengthening and added shldr and pectoralis stretches as listed in log.  Good shldr flexion and abd approx 165?? in supine. Ended with vasocompression to reduced edema,  and to minimize delayed on set of muscle pain for program.      '[x]'  No change. Weak scapular retractor  and depressors.     '[]'  Other:  '[x]'  Patient would continue to benefit from skilled physical therapy services in order to improve L RTC and periscapular strength in order to return full ADLS, functional activity and golf.    STG: (to be met in 8 treatments)  1. ? Pain: Decrease left shoulder pain to 1/10 on average, 4/10 at worst  2. ? ROM: Increase left shoulder AROM flexion and abduction to 140??, PROM IR and ER to 60??  3. ? Strength: Increase left shoulder flex and abd to 4+/5, IR to 4+/5, and ER to 4/5  4. ? Function: Improve UEFI to 25% impairment  5. Patient to be independent with home exercise program as demonstrated by performance with correct form without cues.  LTG: (to be met in 12 treatments)  1. Pt will be able to lift a bag of groceries with little to no difficulty  2. Pt will be able to reach overhead for a period of time with little difficulty  3. Improve UEFI to 15% impairment  ??                 Patient goals:  Lesson pain.       Pt. Education:  '[x]'  Yes  '[]'  No  '[]'  Reviewed Prior HEP/Ed  Method of Education: '[x]'  Verbal  '[x]'  Demo  '[x]'  Written  Comprehension of Education:  '[]'  Verbalizes understanding.  '[]'  Demonstrates understanding.  '[]'  Needs  review.  '[]'  Demonstrates/verbalizes HEP/Ed previously given.   Access Code: [email or web address]: [email or web address]: 04/16/2021Prepared by: Juliann Pulse MillsExercises   Supine Shoulder External Rotation with Dowel - 2-3 x daily - 7 x weekly - 1 sets - 3 reps - 15 hold   Corner Pec Major Stretch - 2-3 x daily - 7 x weekly - 1 sets - 3-5 reps - 30 hold   Seated Shoulder Rolls - 2-3 x daily - 7 x weekly - 1-2 sets - 10 reps - 3 hold   Seated Scapular Retraction - 2 x daily - 7 x weekly - 2 sets - 10 reps - 3 hold   Standing Scapular Depression - 2 x daily - 7 x weekly - 2 sets - 10 reps - 5 hold    Plan: '[x]'  Continue current frequency toward long and short term goals.    '[x]'  Specific Instructions for subsequent treatments: progress RTC and periscapular strengthening.       Time In:   0944         Time Out:  1035    Electronically signed by:  Tera Partridge, PTA

## 2019-05-04 ENCOUNTER — Inpatient Hospital Stay: Admit: 2019-05-04 | Discharge: 2019-05-04 | Payer: PRIVATE HEALTH INSURANCE | Primary: Internal Medicine

## 2019-05-04 NOTE — Other (Signed)
_0  [place]  [address]  [phone]  F: [phone] _1  [place]  [address]   [address]  P: [phone]  F: [phone] _2  [place]  [place]  P: [phone]  F: [phone] _3  [place]: [phone]  F: [phone] _4  [place]  [place]   [address]   [place]: [phone]  F: [phone]      Physical Therapy Daily Treatment Note    Date:  05/04/2019  Patient Name:  Adam Cochran    DOB:  04-20-1955  MRN: [id number]   [place] (35/35 visits)  Medical Diagnosis: ??Tear of left infraspinatus tendon, subsequent encounter (I01.655V [ICD-10-CM])                Rehab Codes: M25.512, M25.612, M62.81  Onset Date: 03/31/19               Next Dr.'s appt  Visit# / total visits: 03/12; Progress note for at visit # 8  For STG.      Cancels/No Shows: 0/0    Subjective:    Pain:  _5  Yes  _6  No Location: L shldr.  Pain Rating: (0-10 scale) 2/10  Pain altered Tx:  _7  No  _8  Yes  Action:  Comments: States that his neck is bothering him this morning from sleeping on it wrong last night.  Otherwise, shoulder pain is not bad.      Objective:  Modalities: vaso compression moderate compression x 10 mins at 38??  Precautions:   Exercises:  Exercise Reps/ Time Weight/ Level Comments   Pulley 2 mins ea   Flexion, added    UBE 3/3 mins           Standing      Retro shldr rolls 10x     Wall pectoralis stretches 3x30"     Post. Capsule stretch 3x15"           Prone TG  L30    scap depression 10x A    shldr ext  15x A    rows 15x A     abd 10x A          Supine      Wand ER 10x A Done seated this date   Wand ER stretch 3x15"  Done seated this date         Manual x  Distraction, PROM flexion, abd,  ER.         Standing wand:      IR 10x     Ext 10x           Tband: ?? ?? 10x each 4/16   Lats 15x blue ??   Rows 15x blue ??   IR 15x blue ??w/ towel roll   ER 15x blue ??w/ towel roll         Wall slides:      Flex 10x     Abd 10x     Was on/off 10xea           Other:  ??  Specific Instructions for next treatment: Left shoulder stretches for ROM, strengthening (rotator cuff, and  scapula)         Treatment Charges: Mins Units   _0   Modalities     _1   Ther Exercise 50 3   _2   Manual Therapy     _3   Ther Activities     _4   Aquatics     _5   Vasocompression     _6   Other     Total Treatment time  50 3       Assessment: _7  Progressing toward goals.  Added wall slides into flexion and abduction to improve left shoulder ROM.  Also added wax on/off on the wall.  Lastly, added standing wand IR and extension to improve ROM.  Pt states that ER with theraband is the toughest exercise he does in his program.  Pt declined the need for [facility] after session.       _8  No change.      _9  Other:  _10  Patient would continue to benefit from skilled physical therapy services in order to improve L RTC and periscapular strength in order to return full ADLS, functional activity and golf.    STG: (to be met in 8 treatments)  1. ? Pain: Decrease left shoulder pain to 1/10 on average, 4/10 at worst  2. ? ROM: Increase left shoulder AROM flexion and abduction to 140??, PROM IR and ER to 60??  3. ? Strength: Increase left shoulder flex and abd to 4+/5, IR to 4+/5, and ER to 4/5  4. ? Function: Improve UEFI to 25% impairment  5. Patient to be independent with home exercise program as demonstrated by performance with correct form without cues.  LTG: (to be met in 12 treatments)  1. Pt will be able to lift a bag of groceries with little to no difficulty  2. Pt will be able to reach overhead for a period of time with little difficulty  3. Improve UEFI to 15% impairment  ??                 Patient goals:  Lesson pain.       Pt. Education:  _11  Yes  _12   No  _13  Reviewed Prior HEP/Ed  Method of Education: _14  Verbal  _15  Demo  _16  Written  Comprehension of Education:  _17  Verbalizes understanding.  _18  Demonstrates understanding.  _19  Needs review.  _20  Demonstrates/verbalizes HEP/Ed previously given.   Access Code: [email or web address]: [email or web address]: 04/16/2021Prepared by: Juliann Pulse MillsExercises   Supine Shoulder External Rotation with Dowel - 2-3 x daily - 7 x weekly - 1 sets - 3 reps - 15 hold   Corner Pec Major Stretch - 2-3 x daily - 7 x weekly - 1 sets - 3-5 reps - 30 hold   Seated Shoulder Rolls - 2-3 x daily - 7 x weekly - 1-2 sets - 10 reps - 3 hold   Seated Scapular Retraction - 2 x daily - 7 x weekly - 2 sets - 10 reps - 3 hold   Standing Scapular Depression - 2 x daily - 7 x weekly - 2 sets - 10 reps - 5 hold    Plan: _21  Continue current frequency toward long and short term goals.    _22  Specific Instructions for subsequent treatments: progress RTC and periscapular strengthening.       Time In:   10:02am         Time Out:  10:58am    Electronically signed by:  Wiliam Ke, PT

## 2019-05-07 NOTE — Other (Signed)
[  x] [facility] &  Therapy  [address]    [phone]  F: [phone]   []  [facility] - [facility]  Outpatient Rehabilitation &  Therapy  [address]   [address]  P: [phone]  F: [phone]  []  [facility] -  [facility]  Outpatient Rehabilitation &  Therapy  [address]  P: [phone]  F: [phone] []  [facility]  Outpatient Rehabilitation &  Therapy  [address]  P: [phone]  F: [phone]  []  [facility]  Outpatient Rehabilitation &  Therapy  [address]   [phone]: [phone]  F: [phone]   []  [facility] - [facility] & Therapy  [address]  [phone]: [phone]   F: [phone]     Physical Therapy Cancel/No Show note    Date: 05/07/2019  Patient: Adam Cochran  DOB: 1955-05-14  MRN: [id number]    Cancels/No Shows to date: 0/1    For today's appointment patient:    []   Cancelled    []  Rescheduled appointment    [x]  No-show     Reason given by patient:    []   Patient ill    []   Conflicting appointment    []  No transportation      []  Conflict with work    []  No reason given    []  Weather related    []  COVID-19    [x]  Other:      Comments:  No call, no show. Called pt to inform him of missed appt. Pt states his wife was suppose to call and cancel but forgot.       [x]  Next appointment was confirmed    Electronically signed by: [phone], PTA

## 2019-05-08 ENCOUNTER — Inpatient Hospital Stay: Payer: PRIVATE HEALTH INSURANCE | Primary: Internal Medicine

## 2019-05-10 NOTE — Telephone Encounter (Signed)
LVM for patient to call the office to schedule follow up.      2-3 month colon follow up from 04-23-19

## 2019-05-11 ENCOUNTER — Inpatient Hospital Stay: Admit: 2019-05-11 | Discharge: 2019-05-11 | Payer: PRIVATE HEALTH INSURANCE | Primary: Internal Medicine

## 2019-05-11 NOTE — Other (Signed)
$'[x]'h$  [facility] &  Therapy  [address]  [phone]  F: [phone] '[]'$  [place]  [address]  P: [phone]  F: [phone] '[]'$  [place]  [place]  P: [phone]  F: [phone] '[]'$  [place]: [phone]  F: [phone] '[]'$  [place]  [address]   [place]: [phone]  F: [phone]      Physical Therapy Daily Treatment Note    Date:  05/11/2019  Patient Name:  Adam Cochran    DOB:  [birth year]  MRN: [id number]   [place] (35/35 visits)  Medical Diagnosis: ??Tear of left infraspinatus tendon, subsequent encounter (E83.151V [ICD-10-CM])                Rehab Codes: M25.512, M25.612, M62.81  Onset Date: 03/31/19               Next Dr.'s appt  Visit# / total visits: 04/12; Progress note for at visit # 8  For STG.      Cancels/No Shows: 0/0    Subjective:    Pain:  '[x]'$  Yes  '[]'$  No Location: L shldr.  Pain Rating: (0-10 scale) 4/10  Pain altered Tx:  '[x]'$  No  '[]'$  Yes  Action:  Comments: States that he unexpectedly had to return to work due to a mix up with his FMLA and because of this his shoulder is a little sore, rates it at most a 4/10.      Objective:  Modalities: vaso compression moderate compression x 10 mins at 38??  Precautions:   Exercises:  Exercise Reps/ Time Weight/ Level Comments   Pulley 2 mins ea   Flexion, abduction   UBE 3/3 mins L2.0          Standing      Retro shldr rolls 10x     Wall pectoralis stretches 3x30"     Post. Capsule stretch 3x15"           Prone (Catina Nuss mat)      scap depression      shldr ext  15x 2lb    rows 15x 2lb     abd (T) 10x A          SL ER 2x10 2lb                Supine      Wand ER 10x 1lb    Wand ER stretch 3x15" 1lb    Wand Flex  10x 1lb          Manual x  Distraction, PROM flexion, abd, ER.         Standing wand:      IR 10x 1lb    Ext 10x 1lb          Tband: ?? ?? 10x each 4/16   Lats 15x blue ??   Rows 15x blue ??   IR 15x blue ??w/ towel roll   ER 15x blue ??w/ towel roll         Wall slides:      Flex 10x     Abd 10x  Wax on/off 10xea           Other:  ??  Specific Instructions for next treatment: Left shoulder stretches for ROM, strengthening (rotator cuff, and  scapula)         Treatment Charges: Mins Units   '[]'$   Modalities     '[x]'$   Ther Exercise 44 3   '[]'$   Manual Therapy     '[]'$   Ther Activities     '[]'$   Aquatics     '[]'$   Vasocompression     '[]'$   Other     Total Treatment time  44 3       Assessment: '[x]'$  Progressing toward goals.  Added sidelying ER this date with good tolerance but fatigues quickly.  Scapular PRE's done on prone mat this date not on incline using the total gym.  Prone PRE's done bilaterally and weight was added, again pt had good tolerance but fatigued quickly.      '[]'$  No change.      '[]'$  Other:  '[x]'$  Patient would continue to benefit from skilled physical therapy services in order to improve L RTC and periscapular strength in order to return full ADLS, functional activity and golf.    STG: (to be met in 8 treatments)  1. ? Pain: Decrease left shoulder pain to 1/10 on average, 4/10 at worst  2. ? ROM: Increase left shoulder AROM flexion and abduction to 140??, PROM IR and ER to 60??  3. ? Strength: Increase left shoulder flex and abd to 4+/5, IR to 4+/5, and ER to 4/5  4. ? Function: Improve UEFI to 25% impairment  5. Patient to be independent with home exercise program as demonstrated by performance with correct form without cues.  LTG: (to be met in 12 treatments)  1. Pt will be able to lift a bag of groceries with little to no difficulty  2. Pt will be able to reach overhead for a period of time with little difficulty  3. Improve UEFI to 15% impairment  ??                 Patient goals:  Lesson pain.       Pt. Education:  '[x]'$   Yes  '[]'$  No  '[]'$  Reviewed Prior HEP/Ed  Method of Education: '[x]'$  Verbal  '[x]'$  Demo  '[x]'$  Written  Comprehension of Education:  '[]'$  Verbalizes understanding.  '[]'$  Demonstrates understanding.  '[]'$  Needs review.  '[]'$  Demonstrates/verbalizes HEP/Ed previously given.   Access Code: [email or web address]: [email or web address]: 04/16/2021Prepared by: Juliann Pulse MillsExercises   Supine Shoulder External Rotation with Dowel - 2-3 x daily - 7 x weekly - 1 sets - 3 reps - 15 hold   Corner Pec Major Stretch - 2-3 x daily - 7 x weekly - 1 sets - 3-5 reps - 30 hold   Seated Shoulder Rolls - 2-3 x daily - 7 x weekly - 1-2 sets - 10 reps - 3 hold   Seated Scapular Retraction - 2 x daily - 7 x weekly - 2 sets - 10 reps - 3 hold   Standing Scapular Depression - 2 x daily - 7 x weekly - 2 sets - 10 reps - 5 hold    Plan: '[x]'$  Continue current frequency toward long and short term goals.    '[x]'$  Specific Instructions for subsequent treatments: progress RTC and periscapular strengthening.       Time In:   10:02am         Time Out:  10:52 am  Electronically signed by:  Wiliam Ke, PT

## 2019-05-14 ENCOUNTER — Inpatient Hospital Stay: Admit: 2019-05-14 | Discharge: 2019-05-14 | Payer: PRIVATE HEALTH INSURANCE | Primary: Internal Medicine

## 2019-05-14 NOTE — Other (Signed)
_0  [place]  [address]  [phone]  F: [phone] _1  [place]  [address]   [address]  P: [phone]  F: [phone] _2  [place]  [place]  P: [phone]  F: [phone] _3  [place]: [phone]  F: [phone] _4  [place]  [address]   [place]: [phone]  F: [phone]      Physical Therapy Daily Treatment Note    Date:  05/14/2019  Patient Name:  Adam Cochran    DOB:  Jun 01, 1955  MRN: [id number]   [place] (35/35 visits)  Medical Diagnosis: ??Tear of left infraspinatus tendon, subsequent encounter (Q19.758I [ICD-10-CM])                Rehab Codes: M25.512, M25.612, M62.81  Onset Date: 03/31/19               Next Dr.'s appt    Visit# / total visits: 05/12; Progress note for at visit # 8  For STG.      Cancels/No Shows: 0/0    Subjective:    Pain:  _5  Yes  _6  No Location: L shldr.  Pain Rating: (0-10 scale) 2/10  Pain altered Tx:  _7  No  _8  Yes  Action:  Comments: Shoulder is doing well, has been stretching it out at work.      Objective:  Modalities: vaso compression moderate compression x 10 mins at 38??  Precautions:   Exercises:  Exercise Reps/ Time Weight/ Level Comments   Pulley 2 mins ea   Flexion, abduction   UBE 3/3 mins L2.0          Standing      Retro shldr rolls 10x     Wall pectoralis stretches 3x30"     Post. Capsule stretch 3x15"           Prone (Kysha Muralles mat)            shldr ext  15x 2lb    rows 15x 2lb     abd (T) 15x A          SL ER 2x10 2lb                Supine      Wand ER 10x 1lb    Wand ER stretch 3x15" 1lb Seated this date   Wand Flex 10x 1lb          Manual x  Distraction, PROM flexion, abd, ER.         Standing wand:      IR  10x 1lb    Ext 10x 1lb          Tband: ?? ?? 10x each 4/16   Lats 15x blue ??   Rows 15x blue ??   IR 15x blue ??w/ towel roll   ER 15x blue ??w/ towel roll         Wall slides:      Flex 10x     Abd 10x     Wax on/off 10xea           DB Scaption to 90 10x 2lb  DB shd flexion to 90 10x 2lb                Other:  ??  Specific Instructions for next treatment: Left shoulder stretches for ROM, strengthening (rotator cuff, and  scapula)         Treatment Charges: Mins Units   _0   Modalities     _1   Ther Exercise 44 3   _2   Manual Therapy     _3   Ther Activities     _4   Aquatics     _5   Vasocompression     _6   Other     Total Treatment time  44 3       Assessment: _7  Progressing toward goals.  Added standing dumbbell scaption and flexion to 90?? this date without increase in pain.      _8  No change.      _9  Other:  _10  Patient would continue to benefit from skilled physical therapy services in order to improve L RTC and periscapular strength in order to return full ADLS, functional activity and golf.    STG: (to be met in 8 treatments)  1. ? Pain: Decrease left shoulder pain to 1/10 on average, 4/10 at worst  2. ? ROM: Increase left shoulder AROM flexion and abduction to 140??, PROM IR and ER to 60??  3. ? Strength: Increase left shoulder flex and abd to 4+/5, IR to 4+/5, and ER to 4/5  4. ? Function: Improve UEFI to 25% impairment  5. Patient to be independent with home exercise program as demonstrated by performance with correct form without cues.  LTG: (to be met in 12 treatments)  1. Pt will be able to lift a bag of groceries with little to no difficulty  2. Pt will be able to reach overhead for a period of time with little difficulty  3. Improve UEFI to 15% impairment  ??                 Patient goals:  Lesson pain.       Pt. Education:  _11  Yes  _12  No  _13  Reviewed Prior HEP/Ed  Method of Education: _14  Verbal  _15  Demo  _16  Written  Comprehension of Education:  _17  Verbalizes understanding.  _18  Demonstrates  understanding.  _19  Needs review.  _20  Demonstrates/verbalizes HEP/Ed previously given.   Access Code: [email or web address]: [email or web address]: 04/16/2021Prepared by: Juliann Pulse MillsExercises   Supine Shoulder External Rotation with Dowel - 2-3 x daily - 7 x weekly - 1 sets - 3 reps - 15 hold   Corner Pec Major Stretch - 2-3 x daily - 7 x weekly - 1 sets - 3-5 reps - 30 hold   Seated Shoulder Rolls - 2-3 x daily - 7 x weekly - 1-2 sets - 10 reps - 3 hold   Seated Scapular Retraction - 2 x daily - 7 x weekly - 2 sets - 10 reps - 3 hold   Standing Scapular Depression - 2 x daily - 7 x weekly - 2 sets - 10 reps - 5 hold    Plan: _21  Continue current frequency toward long and short term goals.    _22  Specific Instructions for subsequent treatments: progress RTC and periscapular strengthening.       Time In:   10:00am         Time Out:  10:50 am    Electronically signed by:  Wiliam Ke, PT

## 2019-05-19 ENCOUNTER — Inpatient Hospital Stay: Payer: PRIVATE HEALTH INSURANCE | Primary: Internal Medicine

## 2019-05-21 ENCOUNTER — Inpatient Hospital Stay: Admit: 2019-05-21 | Discharge: 2019-05-21 | Payer: PRIVATE HEALTH INSURANCE | Primary: Internal Medicine

## 2019-05-21 DIAGNOSIS — S46812D Strain of other muscles, fascia and tendons at shoulder and upper arm level, left arm, subsequent encounter: Secondary | ICD-10-CM

## 2019-05-21 NOTE — Other (Signed)
'[x]'  [place]  [address]  [phone]  F: [phone] '[]'  [place]  [address]   [address]  P: [phone]  F: [phone] '[]'  [place]  [place]  P: [phone]  F: [phone] '[]'  [place]  P: [phone]  F: [phone] '[]'  [place]  [place]   [address]   [place]: [phone]  F: [phone]      Physical Therapy Daily Treatment Note    Date:  05/21/2019  Patient Name:  Adam Cochran    DOB:  01-02-1956  MRN: [id number]   [place] (35/35 visits)  Medical Diagnosis: ??Tear of left infraspinatus tendon, subsequent encounter (O67.672C [ICD-10-CM])                Rehab Codes: M25.512, M25.612, M62.81  Onset Date: 03/31/19               Next Dr.'s appt    Visit# / total visits: 06/12; Progress note for at visit # 8  For STG.      Cancels/No Shows: 0/0    Subjective:    Pain:  '[x]'  Yes  '[]'  No Location: L shldr.  Pain Rating: (0-10 scale) 2/10  Pain altered Tx:  '[x]'  No  '[]'  Yes  Action:  Comments: Minimal pain, states he did not work last night.      Objective:  Modalities: vaso compression moderate compression x 10 mins at 38??  Precautions:   Exercises:  Exercise Reps/ Time Weight/ Level Comments   Pulley 2 mins ea   Flexion, abduction   UBE 3/3 mins L3.0          Standing      Retro shldr rolls      Wall pectoralis stretches 3x30"     Post. Capsule stretch 3x15"           Prone (Cody Oliger mat)            shldr ext  15x 2lb    rows 15x 2lb     abd (T) 15x A          SL ER 2x10 2lb                Seated      Wand ER 10x 1lb    Wand ER stretch 3x15" 1lb    Wand Flex 10x 1lb    Wand abd 10x 1lb          Manual   Distraction, PROM flexion, abd, ER.         Standing wand:      IR 15x 1lb     Ext 15x 1lb          Tband: ?? ?? 10x each 4/16   Lats 15x blue ??   Rows 15x blue ??   IR 15x blue ??w/ towel roll   ER 15x blue ??w/ towel roll         Wall slides:      Flex 10x     Abd 10x     Wax on/off 10xea           DB Scaption to  90 10x 2lb    DB shd flexion to 90 10x 2lb                Other:  ??  Specific Instructions for next treatment: Left shoulder stretches for ROM, strengthening (rotator cuff, and  scapula)         Treatment Charges: Mins Units   '[]'   Modalities     '[x]'   Ther Exercise 44 3   '[]'   Manual Therapy     '[]'   Ther Activities     '[]'   Aquatics     '[]'   Vasocompression     '[]'   Other     Total Treatment time  44 3       Assessment: '[x]'  Progressing toward goals.  Increased resistance on UBE this date without difficulty.  All cane exercises done seated this date.  Added cane shoulder abduction as this is the motion that the pt feels is the most limited.   Pt tolerated all exercises well without c/o increase in pain.    '[]'  No change.      '[]'  Other:  '[x]'  Patient would continue to benefit from skilled physical therapy services in order to improve L RTC and periscapular strength in order to return full ADLS, functional activity and golf.    STG: (to be met in 8 treatments)  1. ? Pain: Decrease left shoulder pain to 1/10 on average, 4/10 at worst  2. ? ROM: Increase left shoulder AROM flexion and abduction to 140??, PROM IR and ER to 60??  3. ? Strength: Increase left shoulder flex and abd to 4+/5, IR to 4+/5, and ER to 4/5  4. ? Function: Improve UEFI to 25% impairment  5. Patient to be independent with home exercise program as demonstrated by performance with correct form without cues.  LTG: (to be met in 12 treatments)  1. Pt will be able to lift a bag of groceries with little to no difficulty  2. Pt will be able to reach overhead for a period of time with little difficulty  3. Improve UEFI to 15% impairment  ??                 Patient goals:  Lesson pain.       Pt. Education:  '[x]'  Yes  '[]'  No  '[]'  Reviewed  Prior HEP/Ed  Method of Education: '[x]'  Verbal  '[x]'  Demo  '[x]'  Written  Comprehension of Education:  '[]'  Verbalizes understanding.  '[]'  Demonstrates understanding.  '[]'  Needs review.  '[]'  Demonstrates/verbalizes HEP/Ed previously given.   Access Code: [email or web address]: [email or web address]: 04/16/2021Prepared by: Juliann Pulse MillsExercises   Supine Shoulder External Rotation with Dowel - 2-3 x daily - 7 x weekly - 1 sets - 3 reps - 15 hold   Corner Pec Major Stretch - 2-3 x daily - 7 x weekly - 1 sets - 3-5 reps - 30 hold   Seated Shoulder Rolls - 2-3 x daily - 7 x weekly - 1-2 sets - 10 reps - 3 hold   Seated Scapular Retraction - 2 x daily - 7 x weekly - 2 sets - 10 reps - 3 hold   Standing Scapular Depression - 2 x daily - 7 x weekly - 2 sets - 10 reps - 5 hold    Plan: '[x]'  Continue current frequency toward long and short term goals.    '[x]'  Specific Instructions for subsequent treatments: progress RTC and periscapular strengthening.       Time In:  10:00am         Time Out:  10:52 am    Electronically signed by:  Wiliam Ke, PT

## 2019-05-25 ENCOUNTER — Encounter: Payer: PRIVATE HEALTH INSURANCE | Primary: Internal Medicine

## 2019-05-28 ENCOUNTER — Inpatient Hospital Stay: Admit: 2019-05-28 | Discharge: 2019-05-28 | Payer: PRIVATE HEALTH INSURANCE | Primary: Internal Medicine

## 2019-05-28 NOTE — Other (Signed)
_0  [place]  [address]  [phone]  F: [phone] _1  [place]  [address]   [address]  P: [phone]  F: [phone] _2  [place]  [place]  P: [phone]  F: [phone] _3  [place]: [phone]  F: [phone] _4  [place]  [place]   [address]   [place]: [phone]  F: [phone]      Physical Therapy Daily Treatment Note    Date:  05/28/2019  Patient Name:  Adam Cochran    DOB:  August 02, 1955  MRN: [id number]   [place] (35/35 visits)  Medical Diagnosis: ??Tear of left infraspinatus tendon, subsequent encounter (I26.415A [ICD-10-CM])                Rehab Codes: M25.512, M25.612, M62.81  Onset Date: 03/31/19               Next Dr.'s appt    Visit# / total visits: 07/12; Progress note for at visit # 8  For STG.      Cancels/No Shows: 0/0    Subjective:    Pain:  _5  Yes  _6  No Location: L shldr.  Pain Rating: (0-10 scale) 2/10  Pain altered Tx:  _7  No  _8  Yes  Action:  Comments: Continues to have minimal pain.       Objective:  Modalities: vaso compression moderate compression x 10 mins at 38??  Precautions:   Exercises:  Exercise Reps/ Time Weight/ Level Comments   Pulley 2 mins ea   Flexion, abduction   UBE 3/3 mins L3.0          Standing      Retro shldr rolls      Wall pectoralis stretches 3x30"     Post. Capsule stretch 3x15"           Prone (Jennalynn Rivard mat)            shldr ext  15x 2lb    rows 15x 2lb     abd (T) 15x 2lb          SL ER 2x10 2lb                Seated      Wand ER 10x 2lb    Wand ER stretch 3x15" 2lb    Wand Flex 10x 2lb    Wand abd 10x 2lb          Manual   Distraction, PROM flexion, abd, ER.         Standing wand:      IR 15x 2lb    Ext 15x 2lb           Tband: ?? ?? 10x each 4/16   Lats 15x blue ??   Rows 15x blue ??   IR 15x blue ??w/ towel roll   ER 15x blue ??w/ towel roll         Wall slides:      Flex 10x     Abd 10x     Wax on/off 10xea           DB Scaption to 90 10x 3lb  DB shd flexion to 90 10x 3lb                Other:  ??  Specific Instructions for next treatment: Left shoulder stretches for ROM, strengthening (rotator cuff, and  scapula)         Treatment Charges: Mins Units   _0   Modalities     _1   Ther Exercise 40 3   _2   Manual Therapy     _3   Ther Activities     _4   Aquatics     _5   Vasocompression     _6   Other     Total Treatment time  40 3       Assessment: _7  Progressing toward goals. Increased weight for standing dumbbell flexion and scaption as well as added abduction this date without difficulty.  Increased weight for therabar exercises.  Pt is progressing well and has returned to work since starting therapy, discussed decreasing to 1x/week due to his work schedule and will start this next week.      _8  No change.      _9  Other:  _10  Patient would continue to benefit from skilled physical therapy services in order to improve L RTC and periscapular strength in order to return full ADLS, functional activity and golf.    STG: (to be met in 8 treatments)  1. ? Pain: Decrease left shoulder pain to 1/10 on average, 4/10 at worst  2. ? ROM: Increase left shoulder AROM flexion and abduction to 140??, PROM IR and ER to 60??  3. ? Strength: Increase left shoulder flex and abd to 4+/5, IR to 4+/5, and ER to 4/5  4. ? Function: Improve UEFI to 25% impairment  5. Patient to be independent with home exercise program as demonstrated by performance with correct form without cues.  LTG: (to be met in 12 treatments)  1. Pt will be able to lift a bag of groceries with little to no difficulty  2. Pt will be able to reach overhead for a period of time with little difficulty  3. Improve UEFI to 15% impairment  ??                 Patient goals:  Lesson pain.        Pt. Education:  _11  Yes  _12  No  _13  Reviewed Prior HEP/Ed  Method of Education: _14  Verbal  _15  Demo  _16  Written  Comprehension of Education:  _17  Verbalizes understanding.  _18  Demonstrates understanding.  _19  Needs review.  _20  Demonstrates/verbalizes HEP/Ed previously given.   Access Code: [email or web address]: [email or web address]: 04/16/2021Prepared by: Juliann Pulse MillsExercises   Supine Shoulder External Rotation with Dowel - 2-3 x daily - 7 x weekly - 1 sets - 3 reps - 15 hold   Corner Pec Major Stretch - 2-3 x daily - 7 x weekly - 1 sets - 3-5 reps - 30 hold   Seated Shoulder Rolls - 2-3 x daily - 7 x weekly - 1-2 sets - 10 reps - 3 hold   Seated Scapular Retraction - 2 x daily - 7 x weekly - 2 sets - 10 reps - 3 hold   Standing Scapular Depression - 2 x daily - 7 x weekly - 2 sets - 10 reps - 5 hold    Plan: _21  Continue current frequency toward long and short term goals.    _22  Specific Instructions for subsequent treatments: progress RTC and periscapular strengthening.       Time In:  10:08am         Time Out:  11:00 am    Electronically signed by:  Wiliam Ke, PT

## 2019-06-03 ENCOUNTER — Encounter: Attending: Internal Medicine | Primary: Internal Medicine

## 2019-06-04 ENCOUNTER — Encounter: Payer: PRIVATE HEALTH INSURANCE | Primary: Internal Medicine

## 2019-06-11 NOTE — Discharge Summary (Signed)
[x]  [facility] &  Therapy  [address]  [phone]  F: [phone] []  [facility] - [facility]  Outpatient Rehabilitation &  Therapy  [address]   [address]  P: [phone]  F: [phone] []  [facility] &  Therapy  [address]  P: [phone]  F: [phone] []  [facility]  Outpatient Rehabilitation &  Therapy  [address] [place]   [address]   : [phone]  F: [phone]         Physical Therapy Discharge Note    Date: 07/30/2019      Patient: Adam Cochran  DOB: 01-06-1956  MRN: [id number]    Physician: 08/01/2019, MD                   Insurance: [facility] [employer] [phone] visits)  Medical Diagnosis: ??Tear of left infraspinatus tendon, subsequent encounter [id number] [ICD-10-CM])                Rehab Codes: M25.512, M25.612, M62.81  Onset Date: 03/31/19               Next Dr.'s appt:   Visit# / total visits: 07/12; Progress note for at visit # 8  For STG.                                       Cancels/No Shows: 0/2  Date of initial visit: 04/27/19                Date of final visit: 05/28/19    Subjective:    Pain:  [x] ? Yes  [] ? No   Location: L shldr.       Pain Rating: (0-10 scale) 2/10  Pain altered Tx:  [x] ? No  [] ? Yes  Action:  Comments: Continues to have minimal pain.       Objective:  Refer to initial evaluation.    Assessment:  Refer to initial evaluation.      Treatment to Date:  [x]  Therapeutic Exercise    []  Modalities:  []  Therapeutic Activity    []  Ultrasound  []  Electrical Stimulation  []  Gait Training     []  Massage       []  Lumbar/Cervical Traction  []  Neuromuscular Re-education []  Cold/hotpack []  Iontophoresis: 4 mg/mL  [x]  Instruction in Home Exercise Program                     Dexamethasone Sodium  []  Manual Therapy             Phosphate 40-80 mAmin  []  Aquatic Therapy                   []  Vasocompression/    []  Other:              Game Ready    Discharge Status:     []  Pt to continue exercise/home instructions independently.    []  Therapy interrupted due to:    [x]  Pt has 2 or more no shows/cancels, is discontinued per our policy.    []  Other:         Electronically signed by 04/02/19, PT on 07/30/2019 at 10:16 AM      If you have any  questions or concerns, please don't hesitate to call.  Thank you for your referral.

## 2019-06-11 NOTE — Other (Signed)
[  x] [facility] &  Therapy  [address]    [phone]  F: [phone]   []  [facility]  Outpatient Rehabilitation &  Therapy  [address]   [address]  P: [phone]  F: [phone]  []  [facility]  [facility]  Outpatient Rehabilitation &  Therapy  [address]  P: [phone]  F: [phone] []  [facility]  Outpatient Rehabilitation &  Therapy  [address]  P: [phone]  F: [phone]  []  [facility]  Outpatient Rehabilitation &  Therapy  [address]   [phone]: [phone]  F: [phone]   []  [facility] & Therapy  [address]  [phone]: [phone]   F: [phone]     Physical Therapy Cancel/No Show note    Date: 06/11/2019  Patient: Adam Cochran  DOB: Sep 13, 1955  MRN: [id number]    Cancels/No Shows to date: 0/1    For today's appointment patient:    []   Cancelled    []  Rescheduled appointment    [x]  No-show     Reason given by patient:    []   Patient ill    []   Conflicting appointment    []  No transportation      []  Conflict with work    []  No reason given    []  Weather related    []  COVID-19    [x]  Other:      Comments:  Attempted to call patient with no answer, VM left, unable to confirm next appointment.       []  Next appointment was confirmed    Electronically signed by: [phone], PTA

## 2019-06-12 ENCOUNTER — Inpatient Hospital Stay: Payer: PRIVATE HEALTH INSURANCE | Primary: Internal Medicine

## 2019-07-05 ENCOUNTER — Encounter

## 2019-07-05 NOTE — Telephone Encounter (Signed)
Coreg refill    Pt has a future appointment        Next Visit Date:  Future Appointments   Date Time Provider Department Center   07/20/2019 10:40 AM Tanja Port, MD [facility] IM [facility]       Health Maintenance   Topic Date Due   ??? Diabetic foot exam  Never done   ??? Diabetic retinal exam  Never done   ??? Diabetic microalbuminuria test  Never done   ??? DTaP/Tdap/Td vaccine (2 - Td or Tdap) 10/21/2019 (Originally 03/26/2009)   ??? Flu vaccine (Season Ended) 10/21/2019 (Originally 09/15/2019)   ??? Shingles Vaccine (1 of 2) 10/21/2019 (Originally 07/16/2006)   ??? Pneumococcal 0-64 years Vaccine (1 of 2 - PPSV23) 10/21/2019 (Originally 07/16/1962)   ??? Lipid screen  10/22/2019   ??? Potassium monitoring  10/22/2019   ??? Creatinine monitoring  10/22/2019   ??? A1C test (Diabetic or Prediabetic)  03/30/2020   ??? Colon cancer screen colonoscopy  04/22/2029   ??? COVID-19 Vaccine  Completed   ??? Hepatitis C screen  Completed   ??? HIV screen  Completed   ??? Hepatitis A vaccine  Aged Out   ??? Hib vaccine  Aged Out   ??? Meningococcal (ACWY) vaccine  Aged Out       Hemoglobin A1C (%)   Date Value   03/31/2019 6.2   10/22/2018 6.6 (H)             ( goal A1C is < 7)   No results found for: LABMICR  LDL Cholesterol (mg/dL)   Date Value   60/10/9321 104       (goal LDL is <100)   AST (U/L)   Date Value   10/22/2018 25     ALT (U/L)   Date Value   10/22/2018 21     BUN (mg/dL)   Date Value   55/73/2202 17     BP Readings from Last 3 Encounters:   04/27/19 (!) 162/97   04/23/19 (!) 166/96   04/23/19 114/79          (goal 120/80)    All Future Testing planned in [facility]  Lab Frequency Next Occurrence   Microalbumin, Ur Once 06/29/2019               Patient Active Problem List:     Resistant hypertension

## 2019-07-06 MED ORDER — CARVEDILOL 6.25 MG PO TABS
6.25 MG | ORAL_TABLET | Freq: Two times a day (BID) | ORAL | 3 refills | Status: DC
Start: 2019-07-06 — End: 2019-12-24

## 2019-07-20 ENCOUNTER — Encounter: Attending: Internal Medicine | Primary: Internal Medicine

## 2019-08-02 ENCOUNTER — Encounter

## 2019-08-02 NOTE — Telephone Encounter (Signed)
E-scribing request for hydroCHLOROthiazide . Pt has future appt.       Health Maintenance   Topic Date Due   ??? Diabetic foot exam  Never done   ??? Diabetic retinal exam  Never done   ??? Diabetic microalbuminuria test  Never done   ??? DTaP/Tdap/Td vaccine (2 - Td or Tdap) 10/21/2019 (Originally 03/26/2009)   ??? Shingles Vaccine (1 of 2) 10/21/2019 (Originally 07/16/2006)   ??? Pneumococcal 0-64 years Vaccine (1 of 2 - PPSV23) 10/21/2019 (Originally [birth year]/16/1962)   ??? Flu vaccine (1) 09/15/2019   ??? Lipid screen  10/22/2019   ??? Potassium monitoring  10/22/2019   ??? Creatinine monitoring  10/22/2019   ??? A1C test (Diabetic or Prediabetic)  03/30/2020   ??? Colon cancer screen colonoscopy  04/22/2029   ??? COVID-19 Vaccine  Completed   ??? Hepatitis C screen  Completed   ??? HIV screen  Completed   ??? Hepatitis A vaccine  Aged Out   ??? Hib vaccine  Aged Out   ??? Meningococcal (ACWY) vaccine  Aged Out             (applicable per patient's age: Cancer Screenings, Depression Screening, Fall Risk Screening, Immunizations)    Hemoglobin A1C (%)   Date Value   03/31/2019 6.2   10/22/2018 6.6 (H)     LDL Cholesterol (mg/dL)   Date Value   65/68/1275 104     AST (U/L)   Date Value   10/22/2018 25     ALT (U/L)   Date Value   10/22/2018 21     BUN (mg/dL)   Date Value   17/00/1749 17      (goal A1C is < 7)   (goal LDL is <100) need 30-50% reduction from baseline     BP Readings from Last 3 Encounters:   04/27/19 (!) 162/97   04/23/19 (!) 166/96   04/23/19 114/79    (goal BP 120/80)      All Future Testing planned in [facility]:  Lab Frequency Next Occurrence   Microalbumin, Ur Once 08/07/2019       Next Visit Date:  Future Appointments   Date Time Provider Department Center   09/27/2019 11:00 AM Tanja Port, MD [facility]            Patient Active Problem List:     Resistant hypertension

## 2019-08-09 MED ORDER — HYDROCHLOROTHIAZIDE 25 MG PO TABS
25 MG | ORAL_TABLET | ORAL | 1 refills | Status: DC
Start: 2019-08-09 — End: 2020-02-22

## 2019-09-27 ENCOUNTER — Encounter: Attending: Internal Medicine | Primary: Internal Medicine

## 2019-12-13 ENCOUNTER — Encounter: Payer: PRIVATE HEALTH INSURANCE | Attending: Internal Medicine | Primary: Internal Medicine

## 2019-12-24 ENCOUNTER — Encounter

## 2019-12-24 MED ORDER — CARVEDILOL 6.25 MG PO TABS
6.25 MG | ORAL_TABLET | ORAL | 3 refills | Status: AC
Start: 2019-12-24 — End: ?

## 2019-12-24 NOTE — Telephone Encounter (Signed)
E-scribing request for carvedilol  pt has no future appt. Last seen 04/27/19, pt has no showed last 2 appts. Please advise.     Health Maintenance   Topic Date Due   ??? Pneumococcal 0-64 years Vaccine (1 of 2 - PPSV23) Never done   ??? Diabetic foot exam  Never done   ??? Diabetic microalbuminuria test  Never done   ??? Diabetic retinal exam  Never done   ??? Shingles Vaccine (1 of 2) Never done   ??? DTaP/Tdap/Td vaccine (2 - Td or Tdap) 03/26/2009   ??? Flu vaccine (1) Never done   ??? COVID-19 Vaccine (3 - Booster for Moderna series) 10/20/2019   ??? Lipid screen  10/22/2019   ??? Potassium monitoring  10/22/2019   ??? Creatinine monitoring  10/22/2019   ??? A1C test (Diabetic or Prediabetic)  03/30/2020   ??? Colon cancer screen colonoscopy  04/22/2029   ??? Hepatitis C screen  Completed   ??? HIV screen  Completed   ??? Hepatitis A vaccine  Aged Out   ??? Hib vaccine  Aged Out   ??? Meningococcal (ACWY) vaccine  Aged Out             (applicable per patient's age: Cancer Screenings, Depression Screening, Fall Risk Screening, Immunizations)    Hemoglobin A1C (%)   Date Value   03/31/2019 6.2   10/22/2018 6.6 (H)     LDL Cholesterol (mg/dL)   Date Value   74/16/3845 104     AST (U/L)   Date Value   10/22/2018 25     ALT (U/L)   Date Value   10/22/2018 21     BUN (mg/dL)   Date Value   36/46/8032 17      (goal A1C is < 7)   (goal LDL is <100) need 30-50% reduction from baseline     BP Readings from Last 3 Encounters:   04/27/19 (!) 162/97   04/23/19 (!) 166/96   04/23/19 114/79    (goal BP 120/80)      All Future Testing planned in [facility]:      Next Visit Date:  No future appointments.         Patient Active Problem List:     Resistant hypertension

## 2020-02-17 ENCOUNTER — Encounter

## 2020-02-21 NOTE — Telephone Encounter (Signed)
LM on VM to schedule appt.     E-scribing request for 2 medications pt has no future appt. Last seen 04/27/19. Please advise. Pt has NS last 2 appt.     Health Maintenance   Topic Date Due   ??? Pneumococcal 0-64 years Vaccine (1 of 2 - PPSV23) Never done   ??? Diabetic foot exam  Never done   ??? Diabetic microalbuminuria test  Never done   ??? Diabetic retinal exam  Never done   ??? Shingles Vaccine (1 of 2) Never done   ??? DTaP/Tdap/Td vaccine (2 - Td or Tdap) 03/26/2009   ??? Flu vaccine (1) Never done   ??? COVID-19 Vaccine (3 - Booster for Moderna series) 09/20/2019   ??? Lipid screen  10/22/2019   ??? Potassium monitoring  10/22/2019   ??? Creatinine monitoring  10/22/2019   ??? A1C test (Diabetic or Prediabetic)  03/30/2020   ??? Depression Screen  03/30/2020   ??? Colon cancer screen colonoscopy  04/22/2029   ??? Hepatitis C screen  Completed   ??? HIV screen  Completed   ??? Hepatitis A vaccine  Aged Out   ??? Hib vaccine  Aged Out   ??? Meningococcal (ACWY) vaccine  Aged Out             (applicable per patient's age: Cancer Screenings, Depression Screening, Fall Risk Screening, Immunizations)    Hemoglobin A1C (%)   Date Value   03/31/2019 6.2   10/22/2018 6.6 (H)     LDL Cholesterol (mg/dL)   Date Value   52/84/1324 104     AST (U/L)   Date Value   10/22/2018 25     ALT (U/L)   Date Value   10/22/2018 21     BUN (mg/dL)   Date Value   40/10/2723 17      (goal A1C is < 7)   (goal LDL is <100) need 30-50% reduction from baseline     BP Readings from Last 3 Encounters:   04/27/19 (!) 162/97   04/23/19 (!) 166/96   04/23/19 114/79    (goal BP 120/80)      All Future Testing planned in [facility]:      Next Visit Date:  No future appointments.         Patient Active Problem List:     Resistant hypertension

## 2020-02-22 MED ORDER — AMLODIPINE BESYLATE 10 MG PO TABS
10 MG | ORAL_TABLET | ORAL | 11 refills | Status: AC
Start: 2020-02-22 — End: ?

## 2020-02-22 MED ORDER — HYDROCHLOROTHIAZIDE 25 MG PO TABS
25 | ORAL_TABLET | ORAL | 1 refills | Status: DC
Start: 2020-02-22 — End: 2024-01-23

## 2020-07-07 ENCOUNTER — Inpatient Hospital Stay: Payer: PRIVATE HEALTH INSURANCE | Primary: Internal Medicine

## 2020-07-07 DIAGNOSIS — I10 Essential (primary) hypertension: Secondary | ICD-10-CM

## 2020-07-07 LAB — LIPID PANEL
Chol/HDL Ratio: 2.9 (ref <5)
Cholesterol: 167 mg/dL (ref <200)
HDL: 58 mg/dL (ref >40)
LDL Cholesterol: 98 mg/dL (ref 0–130)
Triglycerides: 53 mg/dL (ref <150)

## 2020-07-07 LAB — CBC WITH AUTO DIFFERENTIAL
Absolute Eos #: 0.1 10*3/uL (ref 0.00–0.44)
Absolute Immature Granulocyte: <0.03 10*3/uL (ref 0.00–0.30)
Absolute Lymph #: 2.56 10*3/uL (ref 1.10–3.70)
Absolute Mono #: 0.51 10*3/uL (ref 0.10–1.20)
Basophils Absolute: 0.04 10*3/uL (ref 0.00–0.20)
Basophils: 1 % (ref 0–2)
Eosinophils %: 2 % (ref 1–4)
Hematocrit: 44.7 % (ref 40.7–50.3)
Hemoglobin: 14.7 g/dL (ref 13.0–17.0)
Immature Granulocytes: 0 %
Lymphocytes: 46 % — ABNORMAL HIGH (ref 24–43)
MCH: 30.1 pg (ref 25.2–33.5)
MCHC: 32.9 g/dL (ref 28.4–34.8)
MCV: 91.6 fL (ref 82.6–102.9)
MPV: 8.8 fL (ref 8.1–13.5)
Monocytes: 9 % (ref 3–12)
NRBC Automated: 0 per 100 WBC
Platelets: 285 10*3/uL (ref 138–453)
RBC: 4.88 m/uL (ref 4.21–5.77)
RDW: 15.5 % — ABNORMAL HIGH (ref 11.8–14.4)
Seg Neutrophils: 42 % (ref 36–65)
Segs Absolute: 2.35 10*3/uL (ref 1.50–8.10)
WBC: 5.6 10*3/uL (ref 3.5–11.3)

## 2020-07-07 LAB — URINALYSIS
Bilirubin Urine: NEGATIVE
Glucose, Ur: NEGATIVE
Leukocyte Esterase, Urine: NEGATIVE
Nitrite, Urine: NEGATIVE
Specific Gravity, UA: 1.021 (ref 1.005–1.030)
Urine Hgb: NEGATIVE
Urobilinogen, Urine: NORMAL
pH, UA: 5.5 (ref 5.0–8.0)

## 2020-07-07 LAB — MICROSCOPIC URINALYSIS: Casts UA: 0 /LPF (ref 0–8)

## 2020-07-07 LAB — COMPREHENSIVE METABOLIC PANEL
ALT: 15 U/L (ref 5–41)
AST: 18 U/L (ref <40)
Albumin/Globulin Ratio: 1.3 (ref 1.0–2.5)
Albumin: 4.3 g/dL (ref 3.5–5.2)
Alkaline Phosphatase: 93 U/L (ref 40–129)
Anion Gap: 9 mmol/L (ref 9–17)
BUN: 13 mg/dL (ref 8–23)
CO2: 26 mmol/L (ref 20–31)
Calcium: 9.1 mg/dL (ref 8.6–10.4)
Chloride: 99 mmol/L (ref 98–107)
Creatinine: 1.01 mg/dL (ref 0.70–1.20)
GFR African American: >60 mL/min (ref >60)
GFR Non-African American: >60 mL/min (ref >60)
Glucose: 112 mg/dL — ABNORMAL HIGH (ref 70–99)
Potassium: 3.7 mmol/L (ref 3.7–5.3)
Sodium: 134 mmol/L — ABNORMAL LOW (ref 135–144)
Total Bilirubin: 0.31 mg/dL (ref 0.3–1.2)
Total Protein: 7.6 g/dL (ref 6.4–8.3)

## 2020-07-07 LAB — TSH WITH REFLEX: TSH: 0.46 u[IU]/mL (ref 0.30–5.00)

## 2020-07-07 LAB — TESTOSTERONE, FREE
Sex Hormone Binding: 95 nmol/L — ABNORMAL HIGH (ref 11–80)
Testosterone, Free: 117.6 pg/mL (ref 47–244)
Testosterone: 1069 ng/dL — ABNORMAL HIGH (ref 220–1000)

## 2020-07-07 LAB — VITAMIN D 25 HYDROXY: Vit D, 25-Hydroxy: 17.3 ng/mL — ABNORMAL LOW (ref >29.9)

## 2020-09-27 ENCOUNTER — Ambulatory Visit
Admit: 2020-09-27 | Discharge: 2020-09-27 | Payer: PRIVATE HEALTH INSURANCE | Attending: Physician Assistant | Primary: Internal Medicine

## 2020-09-27 DIAGNOSIS — L700 Acne vulgaris: Secondary | ICD-10-CM

## 2020-09-27 MED ORDER — TRETINOIN 0.025 % EX CREA
0.025 % | CUTANEOUS | 3 refills | Status: DC
Start: 2020-09-27 — End: 2022-12-31

## 2020-09-27 MED ORDER — BENZOYL PEROXIDE 5 % EX LIQD
5 % | CUTANEOUS | 3 refills | Status: DC
Start: 2020-09-27 — End: 2022-12-31

## 2020-09-27 MED ORDER — CLINDAMYCIN PHOSPHATE 1 % EX LOTN
1 % | CUTANEOUS | 3 refills | Status: DC
Start: 2020-09-27 — End: 2022-12-31

## 2020-09-27 NOTE — Progress Notes (Signed)
Dermatology Patient Note  [facility] SPECIALITY CARE, [facility] HEALTH DERMATOLOGY  [address]  [address]  [place] [address]  Dept: [phone]  Dept Fax: [phone]      VISITDATE: 09/27/2020   REFERRING PROVIDER: No ref. provider found      Adam Cochran is a 65 y.o. male  who presents today in the office for:    New Patient (Pt states he gets cysts on his face that come and go )      HISTORY OF PRESENT ILLNESS:  As above.  Lesions have become tender, at times, in th past.  Pt states that he is a smoker.  Up to date on colonoscopy, with h/o polyps.    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       CURRENT MEDICATIONS:   Current Outpatient Medications   Medication Sig Dispense Refill    ASPIRIN LOW DOSE 81 MG EC tablet TAKE ONE TABLET BY MOUTH ONCE DAILY      tretinoin (RETIN-A) 0.025 % cream Apply pea sized amount to face nightly 45 g 3    clindamycin (CLEOCIN T) 1 % lotion Apply to affected areas daily 60 mL 3    benzoyl peroxide 5 % external liquid Wash affected areas once daily 227 g 3    amLODIPine (NORVASC) 10 MG tablet TAKE ONE TABLET BY MOUTH DAILY 30 tablet 11    hydroCHLOROthiazide (HYDRODIURIL) 25 MG tablet TAKE ONE TABLET BY MOUTH EVERY MORNING 90 tablet 1    carvedilol (COREG) 6.25 MG tablet TAKE ONE TABLET BY MOUTH TWICE DAILY 60 tablet 3    bisacodyl (BISACODYL) 5 MG EC tablet Follow instructions provided given by the physician's office. (Patient not taking: No sig reported) 4 tablet 0     No current facility-administered medications for this visit.       ALLERGIES:   No Known Allergies    SOCIAL HISTORY:  Social History     Tobacco Use    Smoking status: Former     Packs/day: 0.05     Years: 40.00     Pack years: 2.00     Types: Cigars, Cigarettes    Smokeless tobacco: Former     Types: Chew     Quit date: 10/21/1998   Substance Use Topics    Alcohol use: No       Pertinent ROS:  Review of Systems  Skin:  Denies any new changing, growing or bleeding lesions or rashes except as described in the HPI   Constitutional: Denies fevers, chills, and malaise.    PHYSICAL EXAM:   BP 138/86    Pulse 88    Temp 98.2 ??F (36.8 ??C) (Temporal)    Ht 5\' 10"  (1.778 m)    Wt 136 lb 12.8 oz (62.1 kg)    SpO2 100%    BMI 19.63 kg/m??     The patient is generally well appearing, well nourished, alert and conversational. Affect is normal.    Cutaneous Exam:  Physical Exam  Face and neck only was examined.  Multiple cystic lesions, with widespread "ice pick" scarring present.  Punctum is visible on some cysts.    Facial covering was removed during examination.    Diagnoses/exam findings/medical history pertinent to this visit are listed below:    Assessment:   Diagnosis Orders   1. and Racouchot syndrome  tretinoin (RETIN-A) 0.025 % cream  clindamycin (CLEOCIN T) 1 % lotion    benzoyl peroxide 5 % external liquid    AFL (Epic) - Hans Eden, MD, Plastic Surgery, [place]           Plan:  1. [name] and Racouchot syndrome  - chronic illness with progression and/or exacerbation   - tretinoin (RETIN-A) 0.025 % cream; Apply pea sized amount to face nightly  Dispense: 45 g; Refill: 3  - clindamycin (CLEOCIN T) 1 % lotion; Apply to affected areas daily  Dispense: 60 mL; Refill: 3  - benzoyl peroxide 5 % external liquid; Wash affected areas once daily  Dispense: 227 g; Refill: 3  - AFL (Epic) - Hans Eden, MD, Plastic Surgery, [place]      RTC excision if desired    No future appointments.      There are no Patient Instructions on file for this visit.      Electronically signed by Otho Darner, PA-C on 09/27/20 at 1:36 PM EDT

## 2021-01-17 ENCOUNTER — Encounter: Payer: PRIVATE HEALTH INSURANCE | Attending: Physician Assistant | Primary: Internal Medicine

## 2021-01-24 ENCOUNTER — Encounter
Admit: 2021-01-24 | Discharge: 2021-01-25 | Payer: PRIVATE HEALTH INSURANCE | Attending: Physician Assistant | Primary: Internal Medicine

## 2021-01-24 MED ORDER — LIDOCAINE-EPINEPHRINE 1 %-1:100000 IJ SOLN
1 | Freq: Once | INTRAMUSCULAR | Status: AC
Start: 2021-01-24 — End: ?

## 2021-01-24 NOTE — Progress Notes (Signed)
Dermatology Surgery Pre-Visit Checklist    (Please complete with  Y (yes) / N (no) or explain)    Pathologic Diagnosis: n/a    Photo available of surgery site in media: n    Competent to give informed consent? y   If not, who is authorized to do so: n/a    Need for help for wound care: n   If so, who will do so: n/a    Are you diagnosed:   Diabetes: n   If yes, last A1C: n       HIV: n       Hepatitis: n       Current smoker: yes   If yes, how much: breeze pen    So you have any of the following: Pacemaker: n       Defibrillator: n       Artificial Heart valve: n                Artificial Joints: n       Other Implantable Device: n       Organ Transplant: n       Other: n/a    Are you on blood thinners such as: Asprin: y baby daily       Warfarin/Coumadin: n       Other: n/a    Any allergies to the following:  Lidocaine: n       Iodine: n       Adhesive: n       Other: n

## 2021-01-24 NOTE — Progress Notes (Signed)
Dermatology Procedure Note   [facility] SPECIALITY CARE, [facility]  [address]  [address]  [place] [address]  Dept: [phone]  Dept Fax: [phone]      Procedure Date: 01/24/2021  Procedure Time: 11:22 AM    Procedure Practitioner: Otho Darner, PA-C    Procedure: Excision    Pre-Procedure Diagnosis: Cyst    Post-Procedure Diagnosis: Same as Pre-Procedure Diagnosis    Informed Consent: The procedure and its risks were explained including but not limited to pain, bleeding, infection, permanent scar, permanent pigment alteration and recurrence. Consent to proceed with the procedure was obtained from the patient or the parent by the practitioner    Time Out:  A time out was conducted immediately before starting the procedure that confirmed a final verification of the correct patient, correct procedure, and correct site.    Procedure Details:  Excision and layered closure: The 12 mm lesion on the Left lateral brow was cleaned with chlorhexidine and anesthetized with 1% lidocaine with epinephrine. The lesion was then dissected out from an elliptical incision down to subcutaneous fat with a 0 mm margin for a total excised diameter of 12 mm. Hemostasis was then achieved spontaneously. Due to tension on the defect, undermining was performed to allow for closure. The subcutaneous tissues were then brought together with 5-0 Vicryl. The epidermis was approximated with 6-0 chromic gut .  simple  sutures. Final layered closure was 24 mm. Vaseline and a bulky wound dressing was applied.     Procedure Performed By: Otho Darner, PA-C    Estimated Blood Loss: Minimal    Pathologic Specimen: H&E    Procedure Tolerance: Good    Complication(s): None    Electronically signed by Otho Darner, PA-C on 01/24/21 at 11:22 AM EST

## 2021-01-26 ENCOUNTER — Encounter

## 2021-01-26 NOTE — Other (Signed)
We have received and reviewed your biopsy results, which demonstrated a benign skin lesion called an epidermoid cyst.  No further Tx is required for this lesion.

## 2021-01-30 NOTE — Other (Signed)
LVM

## 2021-01-30 NOTE — Other (Signed)
lvm

## 2021-07-19 NOTE — Telephone Encounter (Signed)
Called pt to schedule for Podiatry referral. Pt does not have vm set up/ no answer.

## 2021-08-02 NOTE — Telephone Encounter (Signed)
2nd attempt to schedule for Podiatry referral. No answer and vm full

## 2022-12-31 ENCOUNTER — Ambulatory Visit
Admit: 2022-12-31 | Discharge: 2022-12-31 | Payer: MEDICARE | Attending: Physician Assistant | Admitting: Physician Assistant | Primary: Internal Medicine

## 2022-12-31 VITALS — BP 120/82 | HR 90 | Resp 18 | Ht 67.0 in | Wt 139.0 lb

## 2022-12-31 DIAGNOSIS — L578 Other skin changes due to chronic exposure to nonionizing radiation: Principal | ICD-10-CM

## 2022-12-31 MED ORDER — TRETINOIN 0.05 % EX CREA
0.05 | CUTANEOUS | 3 refills | Status: DC
Start: 2022-12-31 — End: 2023-01-01

## 2022-12-31 MED ORDER — CLINDAMYCIN PHOSPHATE 1 % EX LOTN
1 | CUTANEOUS | 3 refills | Status: DC
Start: 2022-12-31 — End: 2023-01-01

## 2022-12-31 MED ORDER — BENZOYL PEROXIDE 5 % EX LIQD
5 | CUTANEOUS | 3 refills | Status: DC
Start: 2022-12-31 — End: 2023-01-01

## 2022-12-31 NOTE — Progress Notes (Signed)
 Dermatology Patient Note  [facility]  [place] [facility]  [address]  [place] [address]  Dept: [phone]  Dept Fax: [phone]      VISITDATE: 12/31/2022   REFERRING PROVIDER: No ref. provider found

## 2023-01-01 ENCOUNTER — Encounter: Admit: 2023-01-01 | Admitting: Physician Assistant

## 2023-01-01 DIAGNOSIS — L700 Acne vulgaris: Principal | ICD-10-CM

## 2023-01-01 DIAGNOSIS — L578 Other skin changes due to chronic exposure to nonionizing radiation: Secondary | ICD-10-CM

## 2023-01-01 MED ORDER — TRETINOIN 0.05 % EX CREA
0.05 | CUTANEOUS | 3 refills | Status: AC
Start: 2023-01-01 — End: ?

## 2023-01-01 MED ORDER — BENZOYL PEROXIDE 5 % EX LIQD
5 | CUTANEOUS | 3 refills | Status: AC
Start: 2023-01-01 — End: ?

## 2023-01-01 MED ORDER — CLINDAMYCIN PHOSPHATE 1 % EX LOTN
1 | CUTANEOUS | 3 refills | Status: AC
Start: 2023-01-01 — End: ?

## 2023-01-01 NOTE — Telephone Encounter (Signed)
 Girlfriend phones to see where prescriptions were sent. I confirmed [facility]. She states he is unable to get his prescriptions there as he is no longer employed there. She requested prescriptions be sent to [facility] on [address].

## 2023-03-12 ENCOUNTER — Ambulatory Visit: Admit: 2023-03-12 | Discharge: 2023-03-12 | Payer: MEDICARE | Attending: Physician Assistant | Primary: Family Medicine

## 2023-03-12 ENCOUNTER — Other Ambulatory Visit: Admit: 2023-03-12 | Payer: MEDICARE | Primary: Family Medicine

## 2023-03-12 VITALS — BP 122/81 | HR 89 | Ht 70.0 in | Wt 139.0 lb

## 2023-03-12 DIAGNOSIS — L723 Sebaceous cyst: Secondary | ICD-10-CM

## 2023-03-12 NOTE — Progress Notes (Signed)
 Dermatology Procedure Note   [facility]  [place] [facility]  [address]  [place] [address]  Dept: [phone]  Dept Fax: [phone]      Procedure Date: 03/12/2023  Procedure Time: 10:36 AM    Procedure Practitioner: Otho Darner, PA-C    Procedure: Excision    Pre-Procedure Diagnosis: Cyst    Post-Procedure Diagnosis: Same as Pre-Procedure Diagnosis    Informed Consent: The procedure and its risks were explained including but not limited to pain, bleeding, infection, permanent scar, permanent pigment alteration and recurrence. Consent to proceed with the procedure was obtained from the patient or the parent by the practitioner    Time Out:  A time out was conducted immediately before starting the procedure that confirmed a final verification of the correct patient, correct procedure, and correct site.    Procedure Details:  Excision and layered closure: The 13 mm lesion on the left lateral canthus was cleaned with chlorhexidine and anesthetized with 1% lidocaine with epinephrine. The lesion was then dissected out from an elliptical incision down to subcutaneous fat with a 0 mm margin for a total excised diameter of 13 mm. Hemostasis was then achieved with electrocautery. Due to tension on the defect, undermining was performed to allow for closure. The subcutaneous tissues were then brought together with 5-0 Vicryl. The epidermis was approximated with 6-0Prolene. running sutures. Final layered closure was 21 mm. Vaseline and a bulky wound dressing was applied.     Procedure Performed By: Otho Darner, PA-C    Estimated Blood Loss: Minimal    Pathologic Specimen: H&E    Procedure Tolerance: Good    Complication(s): None    Electronically signed by Otho Darner, PA-C on 03/12/23 at 10:36 AM EST

## 2023-03-12 NOTE — Progress Notes (Signed)
 Dermatology Surgery Pre-Visit Checklist    (Please complete with  Y (yes) / N (no) or explain)    Pathologic Diagnosis: cyst    Photo available of surgery site in media: N    Competent to give informed consent? Y   If not, who is authorized to do so: N    Need for help for wound care: n   If so, who will do so: NA  NA  Are you diagnosed:   Diabetes: N   If yes, last A1C: NA       HIV: N       Hepatitis: N       Current smoker: N  If yes, how much: NA    So you have any of the following: Pacemaker: N       Defibrillator: N       Artificial Heart valve: N                Artificial Joints: N       Other Implantable Device: N       Organ Transplant: N       Other: N    Are you on blood thinners such as: Asprin: N       Warfarin/Coumadin: N       Other: N    Any allergies to the following:  Lidocaine: N       Iodine: N       Adhesive: N       Other: N

## 2023-03-17 NOTE — Other (Signed)
 We have received and reviewed your biopsy results, which demonstrated a benign skin lesion called an epidermoid cyst.  No further Tx is required for this lesion.

## 2023-03-20 ENCOUNTER — Ambulatory Visit: Admit: 2023-03-20 | Payer: MEDICARE | Primary: Family Medicine

## 2023-03-20 ENCOUNTER — Encounter

## 2023-03-20 DIAGNOSIS — Z4802 Encounter for removal of sutures: Secondary | ICD-10-CM

## 2023-03-20 NOTE — Progress Notes (Signed)
 Adam Cochran presented for suture removal on the left lateral canthus . The biopsy site was well healed. The suture was removed and a band-aid applied. The patient left in good condition.     Patient came in today for suture removal and from your last note he was supposed to be referred out for cyst on his left cheek and left anterior neck. I dont see the referral in the system. Can you please send that

## 2023-03-20 NOTE — Progress Notes (Signed)
 Referred to Dr. Dartha Lodge.

## 2023-03-25 NOTE — Telephone Encounter (Signed)
 Left voicemail (1st attempt) to schedule new patient visit with Dr. Dartha Lodge

## 2023-04-08 NOTE — Telephone Encounter (Signed)
 Left voicemail (2nd attempt) to schedule new patient visit with Dr. Dartha Lodge

## 2023-04-10 NOTE — Telephone Encounter (Signed)
 Left voicemail (3rd attempt) to schedule new patient visit with Dr. Dartha Lodge

## 2023-05-13 ENCOUNTER — Ambulatory Visit: Payer: MEDICARE | Attending: Physician Assistant | Primary: Family Medicine

## 2023-08-12 ENCOUNTER — Emergency Department: Admit: 2023-08-12 | Payer: MEDICARE | Primary: Family Medicine

## 2023-08-12 ENCOUNTER — Inpatient Hospital Stay
Admit: 2023-08-12 | Discharge: 2023-08-12 | Disposition: A | Discharge status: Home / Self Care | Payer: MEDICARE | Arrived: WI

## 2023-08-12 DIAGNOSIS — R0789 Other chest pain: Principal | ICD-10-CM

## 2023-08-12 LAB — TROPONIN
Troponin, High Sensitivity: 11 ng/L (ref 0–22)
Troponin, High Sensitivity: 11 ng/L (ref 0–22)

## 2023-08-12 LAB — COMPREHENSIVE METABOLIC PANEL
ALT: 18 U/L (ref 10–50)
AST: 25 U/L (ref 10–50)
Albumin/Globulin Ratio: 1.4 (ref 1.0–2.5)
Albumin: 4.2 g/dL (ref 3.5–5.2)
Alkaline Phosphatase: 94 U/L (ref 40–129)
Anion Gap: 12 mmol/L (ref 9–16)
BUN: 15 mg/dL (ref 8–23)
CO2: 24 mmol/L (ref 20–31)
Calcium: 9.3 mg/dL (ref 8.8–10.2)
Chloride: 102 mmol/L (ref 98–107)
Creatinine: 1.1 mg/dL (ref 0.70–1.20)
Est, Glom Filt Rate: 71 mL/min/1.73m2 (ref >60)
Glucose: 98 mg/dL (ref 82–115)
Potassium: 4 mmol/L (ref 3.7–5.3)
Sodium: 138 mmol/L (ref 136–145)
Total Bilirubin: 0.3 mg/dL (ref 0.00–1.20)
Total Protein: 7.3 g/dL (ref 6.6–8.7)

## 2023-08-12 LAB — MAGNESIUM: Magnesium: 2.2 mg/dL (ref 1.6–2.4)

## 2023-08-12 LAB — CBC WITH AUTO DIFFERENTIAL
Basophils %: 1 % (ref 0–2)
Basophils Absolute: 0.03 k/uL (ref 0.00–0.20)
Eosinophils %: 3 % (ref 1–4)
Eosinophils Absolute: 0.22 k/uL (ref 0.00–0.44)
Hematocrit: 40.4 % — ABNORMAL LOW (ref 40.7–50.3)
Hemoglobin: 13.8 g/dL (ref 13.0–17.0)
Immature Granulocytes %: 0 %
Immature Granulocytes Absolute: 0.01 k/uL (ref 0.00–0.30)
Lymphocytes %: 41 % (ref 24–43)
Lymphocytes Absolute: 2.65 k/uL (ref 1.10–3.70)
MCH: 30.8 pg (ref 25.2–33.5)
MCHC: 34.2 g/dL (ref 28.4–34.8)
MCV: 90.2 fL (ref 82.6–102.9)
MPV: 8.8 fL (ref 8.1–13.5)
Monocytes %: 11 % (ref 3–12)
Monocytes Absolute: 0.7 k/uL (ref 0.10–1.20)
NRBC Automated: 0 /100{WBCs}
Neutrophils %: 44 % (ref 36–65)
Neutrophils Absolute: 2.87 k/uL (ref 1.50–8.10)
Platelets: 245 k/uL (ref 138–453)
RBC: 4.48 m/uL (ref 4.21–5.77)
RDW: 15 % — ABNORMAL HIGH (ref 11.8–14.4)
WBC: 6.5 k/uL (ref 3.5–11.3)

## 2023-08-12 NOTE — ED Provider Notes (Signed)
 [facility] EMERGENCY DEPARTMENT  Emergency Department Encounter  Emergency Medicine Attending     Pt Name:Adam Cochran  MRN: [id number]  Birthdate 01-28-1955  Date of evaluation: 08/12/23  PCP:  German Curtistine BIRCH, MD  Note Started: 9:05 PM EDT      CHIEF COMPLAINT       Chief Complaint   Patient presents with    Chest Pain       HISTORY OF PRESENT ILLNESS  (Location/Symptom, Timing/Onset, Context/Setting, Quality, Duration, Modifying Factors, Severity.)      68 year old male presents for intermittent chest pain that is present over the last 2 days.  He describes the pain as sharp in nature.  Denies any history of CAD.  No shortness of breath associated with his symptoms.  Patient states that he has had increased stress in his life as of recent.  No hemoptysis.  No exertional chest pain.            PAST MEDICAL / SURGICAL / SOCIAL / FAMILY HISTORY      has a past medical history of Hypertension and Type 2 diabetes mellitus without complication (HCC).     has a past surgical history that includes Colonoscopy (N/A, 04/23/2019).    Social History     Socioeconomic History    Marital status: Single     Spouse name: Not on file    Number of children: Not on file    Years of education: Not on file    Highest education level: Not on file   Occupational History    Not on file   Tobacco Use    Smoking status: Some Days     Current packs/day: 0.05     Average packs/day: 0.1 packs/day for 40.0 years (2.0 ttl pk-yrs)     Types: Cigars, Cigarettes    Smokeless tobacco: Former     Types: Chew     Quit date: 10/21/1998    Tobacco comments:     breeze   Substance and Sexual Activity    Alcohol use: Yes     Comment: socially    Drug use: Yes     Types: Marijuana Oda)    Sexual activity: Never   Other Topics Concern    Not on file   Social History Narrative    Not on file     Social Drivers of Health     Financial Resource Strain: Low Risk  (03/31/2019)    Overall Financial Resource Strain (CARDIA)     Difficulty of Paying Living  Expenses: Not hard at all   Food Insecurity: No Food Insecurity (03/31/2019)    Hunger Vital Sign     Worried About Running Out of Food in the Last Year: Never true     Ran Out of Food in the Last Year: Never true   Transportation Needs: No Transportation Needs (03/31/2019)    PRAPARE - Therapist, art (Medical): No     Lack of Transportation (Non-Medical): No   Physical Activity: Not on file   Stress: Not on file   Social Connections: Not on file   Intimate Partner Violence: Unknown (03/07/2022)    Received from [facility]    [place] [employer]     Fear of Current or Ex-Partner: Not on file     Emotionally Abused: Not on file     Physically Abused: Not on file     Sexually Abused: Not on file  Physically or Sexually Abused: Not on file   Housing Stability: Not on file       Family History   Problem Relation Age of Onset    Cancer Mother     No Known Problems Father        Allergies:  Patient has no known allergies.    Home Medications:  Prior to Admission medications    Medication Sig Start Date End Date Taking? Authorizing Provider   benzoyl peroxide  5 % external liquid Wash affected areas once daily 01/01/23   Ethel Ditch, PA-C   clindamycin  (CLEOCIN  T) 1 % lotion Apply to affected areas daily 01/01/23   Ethel Ditch, PA-C   tretinoin  (RETIN-A ) 0.05 % cream Apply a pea sized amount to face nightly 01/01/23   Ethel Ditch, PA-C   lisinopril-hydroCHLOROthiazide  (PRINZIDE;ZESTORETIC) 20-12.5 MG per tablet TAKE ONE TABLET BY MOUTH ONCE DAILY 01/04/21   [provider]   ASPIRIN LOW DOSE 81 MG EC tablet TAKE ONE TABLET BY MOUTH ONCE DAILY 07/13/20   [provider]   amLODIPine  (NORVASC ) 10 MG tablet TAKE ONE TABLET BY MOUTH DAILY 02/22/20   Avasthi, Deepti, MD   hydroCHLOROthiazide  (HYDRODIURIL ) 25 MG tablet TAKE ONE TABLET BY MOUTH EVERY MORNING  Patient not taking: Reported on 12/31/2022 02/22/20   Avasthi, Deepti, MD   carvedilol  (COREG ) 6.25 MG tablet  TAKE ONE TABLET BY MOUTH TWICE DAILY 12/24/19   Avasthi, Deepti, MD   bisacodyl  (BISACODYL ) 5 MG EC tablet Follow instructions provided given by the physician's office.  Patient not taking: Reported on 04/27/2019 04/12/19   Agapito Heman, MD         REVIEW OF SYSTEMS       Review of Systems   Constitutional:  Negative for chills and fever.   Eyes:  Negative for visual disturbance.   Respiratory:  Negative for chest tightness, shortness of breath and wheezing.    Cardiovascular:  Positive for chest pain. Negative for leg swelling.   Gastrointestinal:  Negative for abdominal pain, nausea and vomiting.   Musculoskeletal:  Negative for back pain.   Skin:  Negative for color change and wound.   Neurological:  Negative for dizziness, weakness, light-headedness and numbness.       PHYSICAL EXAM      INITIAL VITALS:   BP (!) 141/78   Pulse 61   Temp 98.4 F (36.9 C) (Oral)   Resp (!) 8   Ht 1.778 m (5' 10)   Wt 63.5 kg (140 lb)   SpO2 97%   BMI 20.09 kg/m     Physical Exam  Vitals and nursing note reviewed.   Constitutional:       General: He is not in acute distress.     Appearance: Normal appearance. He is not ill-appearing.   HENT:      Head: Normocephalic and atraumatic.      Nose: Nose normal.      Mouth/Throat:      Mouth: Mucous membranes are moist.   Eyes:      Pupils: Pupils are equal, round, and reactive to light.   Cardiovascular:      Rate and Rhythm: Normal rate and regular rhythm.      Pulses: Normal pulses.      Heart sounds: Normal heart sounds. No murmur heard.  Pulmonary:      Effort: Pulmonary effort is normal. No respiratory distress.      Breath sounds: Normal breath sounds. No wheezing.   Abdominal:  General: Abdomen is flat.   Musculoskeletal:         General: Normal range of motion.      Cervical back: Normal range of motion.   Skin:     General: Skin is warm and dry.      Capillary Refill: Capillary refill takes less than 2 seconds.   Neurological:      General: No focal deficit present.       Mental Status: He is alert and oriented to person, place, and time. Mental status is at baseline.      GCS: GCS eye subscore is 4. GCS verbal subscore is 5. GCS motor subscore is 6.      Cranial Nerves: No cranial nerve deficit.      Sensory: No sensory deficit.      Motor: No weakness.           DDX/DIAGNOSTIC RESULTS / EMERGENCY DEPARTMENT COURSE / MDM     Medical Decision Making  68 year old male presenting for chest pain.  EKG nonischemic.  Vitals show hypertension.  Labs imaging ordered and reviewed.  Troponin normal x 2 electrolytes normal.  Chest x-ray unremarkable.  Low suspicion for ACS at this time.  Low risk heart score.  I updated patient with results.  He has no active chest pain at this time.  Will plan for outpatient follow-up and discharge    Shared decision performed with patient/family/friends regarding discharge.  They are in agreement at this time for plan for discharge.  They were advised to follow-up with appropriate specialists and PCP in 1 day regarding this visit and diagnosis.  They were given opportunity for questioning and questions were answered to their satisfaction.  Appropriate prescriptions and referrals were sent digitally and/or via discharge paperwork.  Discharged in no distress and alert and oriented per their baseline.  They were advised to return to the emergency department for any new or worsening symptoms or if needing further evaluation.      Amount and/or Complexity of Data Reviewed  Labs: ordered.  Radiology: ordered.  ECG/medicine tests: ordered.      History: 0  ECG: 0  Patient Age: 62  Risk Factors: 1  Troponin: 0  Heart Score Total: 3        EKG  Sinus rhythm with sinus arrhythmia with ventricular rate of 73 bpm.  No STEMI.    All EKG's are interpreted by the Emergency Department Physician who either signs or Co-signs this chart in the absence of a cardiologist.    RADIOLOGY:         Interpretation per the Radiologist below, if available at the time of this  note:    XR CHEST PORTABLE   Final Result   1. No acute process.                  EMERGENCY DEPARTMENT COURSE:           Labs Reviewed   CBC WITH AUTO DIFFERENTIAL - Abnormal; Notable for the following components:       Result Value    Hematocrit 40.4 (*)     RDW 15.0 (*)     All other components within normal limits   MAGNESIUM    COMPREHENSIVE METABOLIC PANEL   TROPONIN   TROPONIN       PROCEDURES:    Procedures    CONSULTS:  None      FINAL IMPRESSION      1. Chest pain, unspecified type  DISPOSITION / PLAN     DISPOSITION Decision To Discharge 08/12/2023 06:43:17 AM   DISPOSITION CONDITION Stable           PATIENT REFERRED TO:  German Curtistine BIRCH, MD  [address]  [place] [address]  [phone]    Schedule an appointment as soon as possible for a visit   To follow-up on this visit      DISCHARGE MEDICATIONS:  Discharge Medication List as of 08/12/2023  6:43 AM          Garnette LOISE Nida, DO  Emergency Medicine Physician     (Please note that portions of thisnote were completed with a voice recognition program.  Efforts were made to edit the dictations but occasionally words are mis-transcribed.)        Nida Garnette LOISE, DO  08/13/23 2204

## 2023-08-13 LAB — EKG 12-LEAD
Atrial Rate: 73 {beats}/min
P Axis: 65 degrees
P-R Interval: 164 ms
Q-T Interval: 382 ms
QRS Duration: 98 ms
QTc Calculation (Bazett): 420 ms
R Axis: -65 degrees
T Axis: 58 degrees
Ventricular Rate: 73 {beats}/min

## 2023-10-15 ENCOUNTER — Ambulatory Visit: Admit: 2023-10-15 | Discharge: 2023-10-15 | Payer: MEDICARE | Attending: Physician Assistant | Primary: Family Medicine

## 2023-10-15 VITALS — BP 129/86 | HR 72 | Ht 70.0 in | Wt 140.0 lb

## 2023-10-15 DIAGNOSIS — R221 Localized swelling, mass and lump, neck: Principal | ICD-10-CM

## 2023-10-15 DIAGNOSIS — L723 Sebaceous cyst: Secondary | ICD-10-CM

## 2023-10-15 NOTE — Progress Notes (Cosign Needed)
 Dermatology Patient Note  [facility]  [facility]  [address]  [place] [address]  Dept: [phone]  Dept Fax: [phone]      VISITDATE: 10/15/2023   REFERRING PROVIDER: No ref. provider found      Adam Cochran is a 68 y.o. male  who presents today in the office for:    Procedure (2 cyst possibly- left neck and right eyebrow )      HISTORY OF PRESENT ILLNESS:  Left anterior neck , subcutaneous nodule x weeks.  Pt states that the lesion is very mildly tender, at times.  He feels that it is increasing in size.    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       CURRENT MEDICATIONS:   Current Outpatient Medications   Medication Sig Dispense Refill    benzoyl peroxide  5 % external liquid Wash affected areas once daily 227 g 3    clindamycin  (CLEOCIN  T) 1 % lotion Apply to affected areas daily 60 mL 3    tretinoin  (RETIN-A ) 0.05 % cream Apply a pea sized amount to face nightly 45 g 3    lisinopril-hydroCHLOROthiazide  (PRINZIDE;ZESTORETIC) 20-12.5 MG per tablet TAKE ONE TABLET BY MOUTH ONCE DAILY      ASPIRIN LOW DOSE 81 MG EC tablet TAKE ONE TABLET BY MOUTH ONCE DAILY      amLODIPine  (NORVASC ) 10 MG tablet TAKE ONE TABLET BY MOUTH DAILY 30 tablet 11    carvedilol  (COREG ) 6.25 MG tablet TAKE ONE TABLET BY MOUTH TWICE DAILY 60 tablet 3    hydroCHLOROthiazide  (HYDRODIURIL ) 25 MG tablet TAKE ONE TABLET BY MOUTH EVERY MORNING (Patient not taking: Reported on 10/15/2023) 90 tablet 1    bisacodyl  (BISACODYL ) 5 MG EC tablet Follow instructions provided given by the physician's office. (Patient not taking: Reported on 10/15/2023) 4 tablet 0     Current Facility-Administered Medications   Medication Dose Route Frequency Provider Last Rate Last Admin    lidocaine -EPINEPHrine  1 %-1:100000 injection 20 mL  20 mL IntraDERmal Once Ethel Ditch, PA-C           ALLERGIES:   No Known Allergies    SOCIAL HISTORY:  Social  History     Tobacco Use    Smoking status: Some Days     Current packs/day: 0.05     Average packs/day: 0.1 packs/day for 40.0 years (2.0 ttl pk-yrs)     Types: Cigars, Cigarettes    Smokeless tobacco: Former     Types: Chew     Quit date: 10/21/1998    Tobacco comments:     breeze   Substance Use Topics    Alcohol use: Yes     Comment: socially       Pertinent ROS:  Review of Systems  Skin: Denies any new changing, growing or bleeding lesions or rashes except as described in the HPI   Constitutional: Denies fevers, chills, and malaise.    PHYSICAL EXAM:   BP 129/86   Pulse 72   Ht 1.778 m (5' 10)   Wt 63.5 kg (140 lb)   SpO2 100%   BMI 20.09 kg/m     The patient is generally well appearing, well nourished, alert and conversational. Affect is normal.    Cutaneous Exam:  Physical Exam  Face and neck only was examined.    Facial covering was removed during examination.    Diagnoses/exam  findings/medical history pertinent to this visit are listed below:    Assessment:   Diagnosis Orders   1. Inflamed epidermoid cyst of skin  EXC SKIN BENIG 0.6-1CM FACE,FACIAL    LAYR CLOS WND FACE,FACIAL <2.5CM    Surgical Pathology      2. Localized tenderness of skin  EXC SKIN BENIG 0.6-1CM FACE,FACIAL    LAYR CLOS WND FACE,FACIAL <2.5CM      3. Subcutaneous nodule of neck  US  HEAD NECK SOFT TISSUE           Plan:  1. Inflamed epidermoid cyst of skin  Excision and layered closure: The 6 mm lesion on the right lateral brow was cleaned with chlorhexidine and anesthetized with 1% lidocaine  with epinephrine . The lesion was then dissected out from an elliptical incision down to subcutaneous fat with a 0 mm margin for a total excised diameter of 6 mm. Hemostasis was then achieved spontaneously. Due to tension on the defect, undermining was performed to allow for closure. The subcutaneous tissues were then brought together with 5-0 Vicryl. The epidermis was approximated with 6-0gut. running sutures. Final layered closure was 14 mm.  Vaseline and a bulky wound dressing was applied.   - EXC SKIN BENIG 0.6-1CM FACE,FACIAL  - LAYR CLOS WND FACE,FACIAL <2.5CM  - Surgical Pathology; Future    2. Localized tenderness of skin  - EXC SKIN BENIG 0.6-1CM FACE,FACIAL  - LAYR CLOS WND FACE,FACIAL <2.5CM    3. Subcutaneous nodule of neck  - US  HEAD NECK SOFT TISSUE; Future  - Referral to general surgery, for excision, pending US  results.      RTC per US     No future appointments.      There are no Patient Instructions on file for this visit.      Electronically signed by Venetia Officer, PA-C on 10/15/23 at 10:00 AM EDT

## 2023-10-16 ENCOUNTER — Other Ambulatory Visit: Admit: 2023-10-16 | Payer: MEDICARE | Primary: Family Medicine

## 2023-10-20 NOTE — Result Encounter Note (Signed)
 We have received and reviewed your biopsy results, which demonstrated a benign skin lesion called an inflamed epidermoid cyst.  No further Tx is required for this lesion.

## 2023-11-26 LAB — CBC WITH AUTO DIFFERENTIAL
Basophils %: 0.8 %
Basophils: 0.04 x10^3ul
Lymphocytes %: 38.8 %
MCH: 30.2 pg
MCHC: 33 g/dL
Monocytes %: 9.4 %
Monocytes: 0.47 x10^3ul
Neutrophils %: 48.2 %
Neutrophils: 2.42 x10^3ul
Platelets: 296 x10^3ul
RBC: 5 x10^6ul

## 2023-11-26 LAB — VITAMIN D 25 HYDROXY: Vit D, 25-Hydroxy: 23.7 ng/mL — ABNORMAL LOW

## 2023-11-26 LAB — COMPREHENSIVE METABOLIC PANEL
ALT: 23 U/L
CO2: 31 MMOL/L — ABNORMAL HIGH
Chloride: 103 MMOL/L
Creatinine: 1.1 mg/dL
Potassium: 4.6 MMOL/L
Total Bilirubin: 0.9 mg/dL
Total Protein: 7.9 g/dL
eGFR (CKD-EPI): 73.6 ML/M1.7

## 2023-11-26 LAB — URINALYSIS WITH REFLEX TO CULTURE
Glucose, Ur: NEGATIVE mg/dL
Leukocyte Esterase, Urine: NEGATIVE
Occult Blood,Urine: NEGATIVE
Urobilinogen, Urine: 0.2 mg/dL

## 2024-01-18 ENCOUNTER — Emergency Department: Admit: 2024-01-18 | Payer: MEDICARE | Primary: Family Medicine

## 2024-01-18 ENCOUNTER — Inpatient Hospital Stay
Admit: 2024-01-18 | Discharge: 2024-01-18 | Disposition: A | Discharge status: Home / Self Care | Payer: MEDICARE | Arrived: WI | Attending: Emergency Medicine

## 2024-01-18 LAB — CBC WITH AUTO DIFFERENTIAL
Basophils %: 0 % (ref 0–2)
Basophils Absolute: <0.03 k/uL (ref 0.00–0.20)
Eosinophils %: 0 % — ABNORMAL LOW (ref 1–4)
Eosinophils Absolute: 0.03 k/uL (ref 0.00–0.44)
Hematocrit: 35.4 % — ABNORMAL LOW (ref 40.7–50.3)
Hemoglobin: 11.9 g/dL — ABNORMAL LOW (ref 13.0–17.0)
Immature Granulocytes %: 0 %
Immature Granulocytes Absolute: 0.01 k/uL (ref 0.00–0.30)
Lymphocytes %: 20 % — ABNORMAL LOW (ref 24–43)
Lymphocytes Absolute: 1.8 k/uL (ref 1.10–3.70)
MCH: 29.8 pg (ref 25.2–33.5)
MCHC: 33.6 g/dL (ref 28.4–34.8)
MCV: 88.5 fL (ref 82.6–102.9)
MPV: 8.3 fL (ref 8.1–13.5)
Monocytes %: 16 % — ABNORMAL HIGH (ref 3–12)
Monocytes Absolute: 1.37 k/uL — ABNORMAL HIGH (ref 0.10–1.20)
NRBC Automated: 0 /100{WBCs}
Neutrophils %: 64 % (ref 36–65)
Neutrophils Absolute: 5.63 k/uL (ref 1.50–8.10)
Platelets: 286 k/uL (ref 138–453)
RBC: 4 m/uL — ABNORMAL LOW (ref 4.21–5.77)
RDW: 14.1 % (ref 11.8–14.4)
WBC: 8.9 k/uL (ref 3.5–11.3)

## 2024-01-18 LAB — URINALYSIS WITH REFLEX TO CULTURE
Glucose, Ur: NEGATIVE mg/dL
Leukocyte Esterase, Urine: NEGATIVE
Nitrite, Urine: NEGATIVE
Specific Gravity, UA: 1.025 (ref 1.005–1.030)
Urobilinogen, Urine: NORMAL EU/dL (ref 0.0–1.0)
pH, Urine: 5 (ref 5.0–8.0)

## 2024-01-18 LAB — MICROSCOPIC URINALYSIS
Epithelial Cells, UA: 0 /HPF (ref 0–5)
WBC, UA: 2 /HPF (ref 0–5)

## 2024-01-18 LAB — COMPREHENSIVE METABOLIC PANEL
ALT: 35 U/L (ref 10–50)
AST: 33 U/L (ref 10–50)
Albumin/Globulin Ratio: 1 (ref 1.0–2.5)
Albumin: 3.7 g/dL (ref 3.5–5.2)
Alkaline Phosphatase: 121 U/L (ref 40–129)
Anion Gap: 12 mmol/L (ref 9–16)
BUN: 19 mg/dL (ref 8–23)
CO2: 26 mmol/L (ref 20–31)
Calcium: 9.1 mg/dL (ref 8.8–10.2)
Chloride: 100 mmol/L (ref 98–107)
Creatinine: 1.1 mg/dL (ref 0.70–1.20)
Est, Glom Filt Rate: 73 mL/min/1.73m2 (ref >60)
Glucose: 109 mg/dL (ref 82–115)
Potassium: 3.8 mmol/L (ref 3.7–5.3)
Sodium: 137 mmol/L (ref 136–145)
Total Bilirubin: 0.6 mg/dL (ref 0.00–1.20)
Total Protein: 7.4 g/dL (ref 6.6–8.7)

## 2024-01-18 LAB — LIPASE: Lipase: 12 U/L — ABNORMAL LOW (ref 13–60)

## 2024-01-18 LAB — PROTIME-INR
INR: 1.2
Protime: 15.3 s — ABNORMAL HIGH (ref 11.5–14.2)

## 2024-01-18 MED ORDER — NORMAL SALINE FLUSH 0.9 % IV SOLN
0.9 | INTRAVENOUS | Status: DC | PRN
Start: 2024-01-18 — End: 2024-01-18
  Administered 2024-01-18: 09:00:00 10 mL via INTRAVENOUS

## 2024-01-18 MED ORDER — IOPAMIDOL 76 % IV SOLN
76 | Freq: Once | INTRAVENOUS | Status: AC | PRN
Start: 2024-01-18 — End: 2024-01-18
  Administered 2024-01-18: 09:00:00 75 mL via INTRAVENOUS

## 2024-01-18 MED ORDER — SODIUM CHLORIDE 0.9 % IV BOLUS
0.9 | Freq: Once | INTRAVENOUS | Status: AC
Start: 2024-01-18 — End: 2024-01-18
  Administered 2024-01-18: 09:00:00 80 mL via INTRAVENOUS

## 2024-01-18 NOTE — ED Provider Notes (Addendum)
 "[facility] EMERGENCY DEPARTMENT ENCOUNTER      Pt Name: Adam Cochran  MRN: [id number]  Birthdate 08-Jan-1956  Date of evaluation: 01/18/24    CHIEF COMPLAINT       Chief Complaint   Patient presents with    Abdominal Pain     Umbilical pain, [place], stops about pubic area; reports had an MRI done within the past few months that showed a mass in his bladder    Hematuria     Reports possible blood in urine    Dysuria       HISTORY OF PRESENT ILLNESS   Adam Cochran is a 69 y.o. male who presents with periumbilical pain radiating to the suprapubic region.  Reports some blood in urine.  Pain and blood started recently reportedly was told that he had a mass on MRI done a few months ago and recommended for cystoscopy however this was not performed.    PASTMEDICAL HISTORY     Past Medical History:   Diagnosis Date    Hypertension     Type 2 diabetes mellitus without complication      Past Problem List  Patient Active Problem List   Diagnosis Code    Resistant hypertension I1A.0       SURGICAL HISTORY       Past Surgical History:   Procedure Laterality Date    COLONOSCOPY N/A 04/23/2019    COLONOSCOPY WITH BIOPSY/POLYPECTOMY performed by Bethene Rhodes, MD at [facility]       CURRENT MEDICATIONS       Previous Medications    AMLODIPINE  (NORVASC ) 10 MG TABLET    TAKE ONE TABLET BY MOUTH DAILY    ASPIRIN LOW DOSE 81 MG EC TABLET    TAKE ONE TABLET BY MOUTH ONCE DAILY    BENZOYL PEROXIDE  5 % EXTERNAL LIQUID    Wash affected areas once daily    BISACODYL  (BISACODYL ) 5 MG EC TABLET    Follow instructions provided given by the physician's office.    CARVEDILOL  (COREG ) 6.25 MG TABLET    TAKE ONE TABLET BY MOUTH TWICE DAILY    CLINDAMYCIN  (CLEOCIN  T) 1 % LOTION    Apply to affected areas daily    HYDROCHLOROTHIAZIDE  (HYDRODIURIL ) 25 MG TABLET    TAKE ONE TABLET BY MOUTH EVERY MORNING    LISINOPRIL-HYDROCHLOROTHIAZIDE  (PRINZIDE;ZESTORETIC) 20-12.5 MG PER TABLET    TAKE ONE TABLET BY MOUTH ONCE DAILY    TRETINOIN  (RETIN-A ) 0.05 % CREAM     Apply a pea sized amount to face nightly       ALLERGIES     has no known allergies.    FAMILY HISTORY     He indicated that his mother is deceased. He indicated that his father is deceased. He indicated that his brother is alive.       SOCIAL HISTORY       Social History     Tobacco Use    Smoking status: Some Days     Current packs/day: 0.05     Average packs/day: 0.1 packs/day for 40.0 years (2.0 ttl pk-yrs)     Types: Cigars, Cigarettes    Smokeless tobacco: Former     Types: Chew     Quit date: 10/21/1998    Tobacco comments:     breeze   Substance Use Topics    Alcohol use: Yes     Comment: socially    Drug use: Yes     Types: Marijuana Oda)  PHYSICAL EXAM     INITIAL VITALS: BP (!) 144/93   Pulse (!) 108   Temp 98.7 F (37.1 C)   Resp 16   Wt 68 kg (150 lb)   SpO2 98%   BMI 21.52 kg/m    Physical Exam  Vitals and nursing note reviewed.   Constitutional:       General: He is not in acute distress.     Appearance: He is well-developed. He is not toxic-appearing.   HENT:      Head: Normocephalic and atraumatic.      Nose: Nose normal.      Mouth/Throat:      Mouth: Mucous membranes are moist.   Eyes:      General: No scleral icterus.     Conjunctiva/sclera: Conjunctivae normal.      Pupils: Pupils are equal, round, and reactive to light.   Cardiovascular:      Rate and Rhythm: Normal rate and regular rhythm.   Pulmonary:      Effort: Pulmonary effort is normal. No respiratory distress.   Abdominal:      Comments: No palpable mass, mildly tender to suprapubic palpation   Musculoskeletal:         General: Normal range of motion.   Skin:     General: Skin is warm and dry.      Findings: No erythema or rash.   Neurological:      Mental Status: He is alert and oriented to person, place, and time.   Psychiatric:         Behavior: Behavior normal.         MEDICAL DECISION MAKING / ED COURSE:   Summary of Patient Presentation:      1)  Number and Complexity of Problems  Problem List This Visit:    1.  Bladder mass    2. Gross hematuria        Differential Diagnosis: Mass versus kidney stone versus UTI    Diagnoses Considered but Do Not Suspect: Pneumonia, appendicitis    Pertinent Comorbid Conditions:    Past Medical History:   Diagnosis Date    Hypertension     Type 2 diabetes mellitus without complication        2)  Data Reviewed    External Documents Reviewed: Previous ER visits reviewed by myself      3)  Treatment and Disposition  [Patient repeat assessment, Disposition discussion with patient/family, Case discussed with consulting clinician, MIPS, Social determinants of health impacting treatment or disposition, Shared Decision Making, Code Status Discussion:]    Labs overall unremarkable does have gross hematuria on urinalysis.  CT imaging is concerning for suspected mass proximal lateral wall of bladder recommending cystoscopy.  Multiple other lesions to adrenal nodule as well as cystic lesion to hepatic lobe.  Will discuss with urology however suspect these are all chronic issues and most likely could be discharged to get cystoscopy outpatient.  No urinary retention.  Pain is minimal.  Discussed case with Dr. Keneth of urology.  Agreeable outpatient follow-up.  Patient with minimal complaints currently.  Stressed the importance of following up for cystoscopy given mass concerning for neoplasm.      Based on the low acuity of concerning symptoms and improvement of symptoms, patient will be discharged with follow up and prescription information listed in the Disposition section.  Patient states they will follow-up with primary care physician and/or return to the emergency department should they experience a change or worsening of  symptoms.  Patient will be discharged with resources: summary of visit, instructions, follow-up information, prescriptions if necessary.  Patient/ family instructed to read discharge paperwork. All of their questions and concerns were addressed.   Suspicion for any acute  life-threatening processes is low.   Patient voices understanding of plan.        DATA FOR LAB AND RADIOLOGY TESTS ORDERED BELOW ARE REVIEWED BY THE ED CLINICIAN:    RADIOLOGY: All x-rays, CT, MRI, and formal ultrasound images (except ED bedside ultrasound) are read by the radiologist, see reports below, unless otherwise noted in MDM or here.  Reports below are reviewed by myself.  CT ABDOMEN PELVIS W IV CONTRAST Additional Contrast? None   Final Result   1. No acute abnormality in the abdomen or pelvis.   2. Possible mass along the left lateral wall of the urinary bladder measuring   approximately 3.1 x 1.8 cm.  A neoplasm is not excluded.  Recommend further   evaluation with cystoscopy.   3. 2.7 cm right adrenal nodule.  7.7 cm mildly complex appearing cystic   lesion in the left hepatic lobe.  Recommend further evaluation with a   nonemergent MRI with and without contrast.   4. 1.1 cm indeterminate cystic lesion in the right kidney. Recommend further   evaluation with a nonemergent renal protocol MRI or CT.             LABS: Lab orders shown below, the results are reviewed by myself, and all abnormals are listed below.  Labs Reviewed   CBC WITH AUTO DIFFERENTIAL - Abnormal; Notable for the following components:       Result Value    RBC 4.00 (*)     Hemoglobin 11.9 (*)     Hematocrit 35.4 (*)     Lymphocytes % 20 (*)     Monocytes % 16 (*)     Eosinophils % 0 (*)     Monocytes Absolute 1.37 (*)     All other components within normal limits   LIPASE - Abnormal; Notable for the following components:    Lipase 12 (*)     All other components within normal limits   PROTIME-INR - Abnormal; Notable for the following components:    Protime 15.3 (*)     All other components within normal limits   URINALYSIS WITH REFLEX TO CULTURE - Abnormal; Notable for the following components:    Turbidity UA SLIGHTLY CLOUDY (*)     Bilirubin, Urine   (*)     Value: Presumptive positive. Unable to confirm due to unavailability of  reagent.    Ketones, Urine TRACE (*)     Urine Hgb 3+ (*)     Protein, UA 2+ (*)     All other components within normal limits   MICROSCOPIC URINALYSIS - Abnormal; Notable for the following components:    Bacteria, UA FEW (*)     Mucus, UA 2+ (*)     All other components within normal limits   COMPREHENSIVE METABOLIC PANEL       Vitals Reviewed:    Vitals:    01/18/24 0224 01/18/24 0225 01/18/24 0227   BP: (!) 144/93     Pulse: (!) 108     Resp: 16     Temp:   98.7 F (37.1 C)   SpO2: 98%     Weight:  68 kg (150 lb)      MEDICATIONS GIVEN TO PATIENT THIS ENCOUNTER:  Orders Placed This  Encounter   Medications    sodium chloride  0.9 % bolus 80 mL    iopamidol  (ISOVUE -370) 76 % injection 75 mL    sodium chloride  flush 0.9 % injection 10 mL     DISCHARGE PRESCRIPTIONS:  New Prescriptions    No medications on file     PHYSICIAN CONSULTS ORDERED THIS ENCOUNTER:  IP CONSULT TO UROLOGY    ED Course Notes From Epic Narrator:  ED Course as of 01/18/24 0601   [place] Jan 18, 2024   0504 CT imaging does show a probable mass in the bladder with multiple other areas of masses in the liver.  Of unclear etiology however neoplasm is in the differential.  Will discuss with Dr. Keneth urology suspect patient can follow-up out patient is all labs otherwise are normal urinating normally. [MS]      ED Course User Index  [MS] Levan Cough, DO         CRITICAL CARE:   0    PROCEDURES:  none    FINAL IMPRESSION      1. Bladder mass    2. Gross hematuria          DISPOSITION/PLAN   DISPOSITION Decision To Discharge 01/18/2024 05:55:27 AM   DISPOSITION CONDITION Stable           OUTPATIENT FOLLOW UP THE PATIENT:  Keneth Cain, MD  [address]  [address]  [place] [address]  [phone]    Call on 01/19/2024        DISCHARGE MEDICATIONS:  New Prescriptions    No medications on file       Cough Levan, DO  EmergencyMedicine Attending    (Please note that portions of this note were completed with a voice recognition program.   Efforts were made to edit the dictations but occasionally words are mis-transcribed.)     Chanon Loney, DO  01/18/24 0600       Shaneice Barsanti, DO  01/18/24 0601    "

## 2024-01-18 NOTE — Discharge Instructions (Signed)
"  As discussed please follow-up with the urologist to schedule your cystoscopy.    PLEASE RETURN TO THE EMERGENCY DEPARTMENT IMMEDIATELY for worsening symptoms, inability to urinate, worsening of blood in your urine, or if you develop any concerning symptoms such as: high fever not relieved by acetaminophen  (Tylenol ) and/or ibuprofen (Motrin / Advil), chills, shortness of breath, chest pain, feeling of your heart fluttering or racing, persistent nausea and/or vomiting, vomiting up blood, blood in your stool, numbness, loss of consciousness, weakness or tingling in the arms or legs or change in color of the extremities, changes in mental status, persistent headache, blurry vision, loss of bladder / bowel control, unable to follow up with your physician, or other any other care or concern.   "

## 2024-01-23 ENCOUNTER — Ambulatory Visit: Admit: 2024-01-23 | Discharge: 2024-01-23 | Payer: MEDICARE | Primary: Family Medicine

## 2024-01-23 LAB — POCT COVID-19 ANTIGEN CARD
Lot Number: 931047
SARS-COV-2, POC: NOT DETECTED

## 2024-01-23 LAB — POCT INFLUENZA A/B ANTIGEN
Inflenza A Ag: NEGATIVE
Influenza B Ag: NEGATIVE

## 2024-01-23 NOTE — Progress Notes (Signed)
 [facility]  [address] [place] [address]  Phone: [phone]  Fax: [phone]      Toma Arts  January 27, 1955  MRN: [id number]  Date of visit: 01/23/2024    Chief Complaint:     Marinda Daring (DOB:  September 16, 1955) is a 69 y.o. male,New patient, here for evaluation of the following chief complaint(s):  Cough (Pt stated that he's been having an lingering cough , body aches and sweets /Sx-3day )      No Known Allergies     Current Outpatient Medications   Medication Sig Dispense Refill    benzoyl peroxide  5 % external liquid Wash affected areas once daily 227 g 3    clindamycin  (CLEOCIN  T) 1 % lotion Apply to affected areas daily 60 mL 3    tretinoin  (RETIN-A ) 0.05 % cream Apply a pea sized amount to face nightly 45 g 3    lisinopril-hydroCHLOROthiazide  (PRINZIDE;ZESTORETIC) 20-12.5 MG per tablet TAKE ONE TABLET BY MOUTH ONCE DAILY      ASPIRIN LOW DOSE 81 MG EC tablet TAKE ONE TABLET BY MOUTH ONCE DAILY      amLODIPine  (NORVASC ) 10 MG tablet TAKE ONE TABLET BY MOUTH DAILY 30 tablet 11    carvedilol  (COREG ) 6.25 MG tablet TAKE ONE TABLET BY MOUTH TWICE DAILY 60 tablet 3     Current Facility-Administered Medications   Medication Dose Route Frequency Provider Last Rate Last Admin    lidocaine -EPINEPHrine  1 %-1:100000 injection 20 mL  20 mL IntraDERmal Once Ethel Ditch, PA-C            Past Medical History:   Diagnosis Date    Hypertension     Type 2 diabetes mellitus without complication           Subjective/Objective:     Patient is a 69 year old man who presents to the urgent care today with concerns of fatigue, cough, body aches and sweats. Patient states it has been going for the past 3 days. Patient states that he has not taken anything. Reports that his wife is also at home sick.           Vitals:    01/23/24 1139   BP: 97/73   Pulse: 86   Resp: 18   Temp: 97.3 F (36.3 C)   TempSrc: Temporal   SpO2: 96%   Weight: 68 kg (150 lb)   Height: 1.778 m (5' 10)       Review of Systems    Constitutional:  Positive for diaphoresis and fatigue.   Respiratory:  Positive for cough.    Musculoskeletal:  Positive for myalgias.   All other systems reviewed and are negative.      Physical Exam  Vitals and nursing note reviewed.   Constitutional:       Appearance: Normal appearance.   HENT:      Right Ear: Tympanic membrane normal.      Left Ear: Tympanic membrane normal.      Nose: Mucosal edema and congestion present.      Mouth/Throat:      Pharynx: Posterior oropharyngeal erythema and postnasal drip present.   Cardiovascular:      Rate and Rhythm: Normal rate and regular rhythm.   Pulmonary:      Effort: Pulmonary effort is normal.      Breath sounds: Normal breath sounds.   Skin:     General: Skin is warm and dry.   Neurological:  Mental Status: He is alert.   Psychiatric:         Mood and Affect: Mood normal.         Behavior: Behavior normal.           Assessment and Plan     No results found for this visit on 01/23/24.     Diagnosis Orders   1. Influenza        2. Cough, unspecified type  POCT COVID-19 Antigen Card    POCT Influenza A/B Antigen      3. Other fatigue                 No orders of the defined types were placed in this encounter.      MDM: Based on history and physical exam, findings consistent with influenza. No evidence of strep, AOM, bacterial sinusitis, pneumonia, or sepsis. Outside the 48-hour window to start Tamiflu. Advised follow-up with PCP, return with any new or worsening symptoms. Patient verbalized understanding and agreeable with plan.     Discussed exam, results, diagnosis, plan of care, and follow-up at length with patient.  Reviewed all prescribed and recommended medications, administration, and side effects. Encouraged patient to follow up with PCP or return to the clinic if symptoms fail to improve or worsen. All questions were answered. Verbalized understanding of discharge instructions.

## 2024-01-28 LAB — CBC WITH AUTO DIFFERENTIAL
Basophils %: 0.4 %
Basophils: 0.03 x10^3ul
Eosinophils %: 0.8 %
Eosinophils: 0.06 x10^3ul
Hematocrit: 38.6 % — ABNORMAL LOW
Hemoglobin: 13 g/dL — ABNORMAL LOW
Immature Granulocytes %: 0.02 x10^3ul
Immature Granulocytes %: 0.3 %
Lymphocytes %: 35.2 %
Lymphocytes: 2.52 x10^3ul
MCH: 29.7 pg
MCHC: 33.7 g/dL
MCV: 88.1 fl
Monocytes %: 10.5 %
Monocytes: 0.75 x10^3ul
Neutrophils %: 52.8 %
Neutrophils: 3.77 x10^3ul
Platelets: 604 x10^3ul — ABNORMAL HIGH
RBC: 4.38 x10^6ul — ABNORMAL LOW
RDW-SD: 47.7 fl
WBC: 7.15 x10^3ul

## 2024-01-28 LAB — PSA, TOTAL AND FREE
PSA, Free Pct: 18 % — ABNORMAL LOW (ref >26.0)
PSA, Free: 0.34 ng/mL
PSA: 1.86 ng/mL

## 2024-01-28 LAB — ALBUMIN, RANDOM URINE
Albumin Urine: 5.49 mg/dL — ABNORMAL HIGH
Albumin/Creatinine Ratio: 32.4 mg/g{creat} — ABNORMAL HIGH
Creatinine, Ur: 169.6 mg/dL

## 2024-02-02 LAB — URINALYSIS WITH REFLEX TO CULTURE
Bilirubin, Urine: NEGATIVE
Glucose, Ur: NEGATIVE mg/dL
Ketones, Urine: NEGATIVE mg/dL
Leukocyte Esterase, Urine: NEGATIVE
Nitrite, Urine: NEGATIVE
Specific Gravity, UA: 1.021 (ref 1.001–1.035)
Urobilinogen, Urine: 0.2 mg/dL
pH, Urine: 5.5

## 2024-02-02 LAB — ANA WITH TITER
ANA Titer: 1:320 {titer} — ABNORMAL HIGH
ANA by HEp-2 Cells: POSITIVE — ABNORMAL HIGH

## 2024-02-02 LAB — COMPLEMENT COMPONENTS 3 & 4
Complement C3: 145 mg/dL
Complement C4: 53 mg/dL — ABNORMAL HIGH

## 2024-02-02 LAB — ELECTROPHORESIS PROTEIN, SERUM
Albumin/Globulin Ratio: 1.1 — ABNORMAL LOW
Albumin: 4.1 g/dL
Alpha-1-Globulin: 0.3 g/dL
Alpha-2-Globulin: 1 g/dL
Beta Globulin: 1.1 g/dL
Gamma Globulin: 1.2 g/dL
Globulin: 3.6 g/dL — ABNORMAL HIGH
M Spike 1, Electrophoresis Protein Serum: 0 g/dL
Total Protein: 7.7 g/dL

## 2024-02-02 LAB — PTH, INTACT: Pth Intact: 31.7 pg/mL

## 2024-02-02 LAB — COMPREHENSIVE METABOLIC PANEL
ALT: 39 U/L
AST: 31 U/L
Albumin: 3.9 g/dL
Alk Phosphatase: 157 U/L — ABNORMAL HIGH
BUN: 22 mg/dL
CO2: 25 MMOL/L
Calcium: 9.5 mg/dL
Chloride: 104 MMOL/L
Creatinine: 1.14 mg/dL
Glucose: 125 mg/dL — ABNORMAL HIGH
Potassium: 4.7 MMOL/L
Sodium: 138 MMOL/L
Total Bilirubin: 0.7 mg/dL
Total Protein: 7.7 g/dL
eGFR (CKD-EPI): 70.5 ML/M1.7

## 2024-02-02 LAB — VITAMIN D 25 HYDROXY: Vit D, 25-Hydroxy: 17.2 ng/mL — ABNORMAL LOW
